# Patient Record
Sex: Female | Born: 1990 | Race: Black or African American | Hispanic: No | Marital: Single | Smoking: Never smoker
Health system: Southern US, Community
[De-identification: ages and names within clinical notes are randomized; demographics above are authoritative.]

## PROBLEM LIST (undated history)

## (undated) DIAGNOSIS — O24419 Gestational diabetes mellitus in pregnancy, unspecified control: Secondary | ICD-10-CM

## (undated) HISTORY — DX: Gestational diabetes mellitus in pregnancy, unspecified control: O24.419

## (undated) HISTORY — PX: NO PAST SURGERIES: SHX2092

---

## 2017-03-31 NOTE — L&D Delivery Note (Addendum)
Patient is a 27 y.o. now G2P2 s/p NSVD at 4944w3d, who was admitted for SOL.  She progressed without augmentation to complete and pushed 6 minutes to deliver in water birth tub. Cord clamping not delayed due to no muscle tone or respiratory effort after vigorous stimulation. Code Apgar initiated Clamped by CNM and cut by FOB. Baby immediately handed over to NICU for resuscitation. Mom moved from water birth tub to bed for delivery of placenta. Placenta intact and spontaneous, bleeding minimal. Mom and baby stable prior to transfer to postpartum. She plans on breastfeeding. She is undecided of method for birth control.  Delivery Note At 4:37 AM a viable female was delivered via Vaginal, Spontaneous (Presentation: OA ).  APGAR: 1, 9; weight pending.   Placenta intact and spontaneous, bleeding minimal. 3V Cord:  with the following complications: Code Apgar .  Cord pH: pending   Anesthesia: None  Episiotomy: None Lacerations: None Suture Repair: None Est. Blood Loss (mL): 100  Mom to postpartum.  Baby to Couplet care / Skin to Skin.  Sharyon CableVeronica C Genna Casimir CNM  11/12/2017, 5:09 AM

## 2017-09-07 ENCOUNTER — Ambulatory Visit (INDEPENDENT_AMBULATORY_CARE_PROVIDER_SITE_OTHER): Payer: Medicaid Other | Admitting: Obstetrics & Gynecology

## 2017-09-07 ENCOUNTER — Encounter: Payer: Self-pay | Admitting: Obstetrics & Gynecology

## 2017-09-07 DIAGNOSIS — Z3400 Encounter for supervision of normal first pregnancy, unspecified trimester: Secondary | ICD-10-CM

## 2017-09-07 DIAGNOSIS — O99210 Obesity complicating pregnancy, unspecified trimester: Secondary | ICD-10-CM | POA: Insufficient documentation

## 2017-09-07 DIAGNOSIS — Z3403 Encounter for supervision of normal first pregnancy, third trimester: Secondary | ICD-10-CM

## 2017-09-07 DIAGNOSIS — E669 Obesity, unspecified: Secondary | ICD-10-CM

## 2017-09-07 DIAGNOSIS — O99213 Obesity complicating pregnancy, third trimester: Secondary | ICD-10-CM

## 2017-09-07 NOTE — Progress Notes (Signed)
  Subjective:    Evelyn Sutton is being seen today for her first obstetrical visit.   She is at 1458w0d gestation. Her obstetrical history is significant for obesity. Relationship with FOB: spouse, living together. Patient does intend to breast feed. Pregnancy history fully reviewed.  Patient reports no complaints.  Review of Systems:   Review of Systems  Objective:     BP 108/75   Pulse (!) 111   Ht 5\' 5"  (1.651 m)   Wt 236 lb (107 kg)   LMP 01/26/2017   BMI 39.27 kg/m  Physical Exam  Exam    Assessment:    Pregnancy: G2P1001 Patient Active Problem List   Diagnosis Date Noted  . Supervision of normal first pregnancy, antepartum 09/07/2017  . Obesity in pregnancy 09/07/2017       Plan:     Initial labs drawn. Prenatal vitamins. Problem list reviewed and updated. Role of ultrasound in pregnancy discussed; fetal survey: ordered. Amniocentesis discussed: not indicated. Follow up in 2 weeks. TDAP, 28 week labs later this week when she is fasting   Evelyn Sutton 09/07/2017

## 2017-09-07 NOTE — Progress Notes (Signed)
Last pap 02/12/17- normal PT will do 2 hour GTT this week Pt is unsure about Tdap

## 2017-09-07 NOTE — Progress Notes (Signed)
PT refused to give e-mail address so I am unable to sign her up for babyscripts app

## 2017-09-11 ENCOUNTER — Encounter (HOSPITAL_COMMUNITY): Payer: Self-pay

## 2017-09-11 ENCOUNTER — Other Ambulatory Visit: Payer: Medicaid Other

## 2017-09-11 DIAGNOSIS — Z3483 Encounter for supervision of other normal pregnancy, third trimester: Secondary | ICD-10-CM

## 2017-09-14 LAB — CBC
HCT: 27.6 % — ABNORMAL LOW (ref 35.0–45.0)
Hemoglobin: 9.1 g/dL — ABNORMAL LOW (ref 11.7–15.5)
MCH: 28 pg (ref 27.0–33.0)
MCHC: 33 g/dL (ref 32.0–36.0)
MCV: 84.9 fL (ref 80.0–100.0)
MPV: 10.4 fL (ref 7.5–12.5)
PLATELETS: 212 10*3/uL (ref 140–400)
RBC: 3.25 10*6/uL — ABNORMAL LOW (ref 3.80–5.10)
RDW: 14.2 % (ref 11.0–15.0)
WBC: 7.9 10*3/uL (ref 3.8–10.8)

## 2017-09-14 LAB — RPR: RPR: NONREACTIVE

## 2017-09-14 LAB — HIV ANTIBODY (ROUTINE TESTING W REFLEX): HIV: NONREACTIVE

## 2017-09-14 LAB — 2HR GTT W 1 HR, CARPENTER, 75 G
GLUCOSE, FASTING, GEST: 89 mg/dL (ref 65–91)
Glucose, 1 Hr, Gest: 155 mg/dL (ref 65–179)
Glucose, 2 Hr, Gest: 140 mg/dL (ref 65–152)

## 2017-09-18 ENCOUNTER — Other Ambulatory Visit: Payer: Self-pay | Admitting: Obstetrics & Gynecology

## 2017-09-18 ENCOUNTER — Ambulatory Visit (HOSPITAL_COMMUNITY)
Admission: RE | Admit: 2017-09-18 | Discharge: 2017-09-18 | Disposition: A | Discharge status: Home / Self Care | Payer: Medicaid Other | Source: Ambulatory Visit | Attending: Obstetrics & Gynecology | Admitting: Obstetrics & Gynecology

## 2017-09-18 DIAGNOSIS — Z363 Encounter for antenatal screening for malformations: Secondary | ICD-10-CM | POA: Insufficient documentation

## 2017-09-18 DIAGNOSIS — Z3A33 33 weeks gestation of pregnancy: Secondary | ICD-10-CM | POA: Insufficient documentation

## 2017-09-18 DIAGNOSIS — O0933 Supervision of pregnancy with insufficient antenatal care, third trimester: Secondary | ICD-10-CM | POA: Diagnosis not present

## 2017-09-18 DIAGNOSIS — O99213 Obesity complicating pregnancy, third trimester: Secondary | ICD-10-CM | POA: Insufficient documentation

## 2017-09-18 DIAGNOSIS — Z3400 Encounter for supervision of normal first pregnancy, unspecified trimester: Secondary | ICD-10-CM

## 2017-09-18 NOTE — Addendum Note (Signed)
Encounter addended by: Levonne HubertStalter, Naziah Portee M, RDMS, RVT on: 09/18/2017 11:39 AM  Actions taken: Imaging Exam ended

## 2017-09-21 ENCOUNTER — Encounter: Payer: Self-pay | Admitting: Advanced Practice Midwife

## 2017-09-21 ENCOUNTER — Ambulatory Visit (INDEPENDENT_AMBULATORY_CARE_PROVIDER_SITE_OTHER): Payer: Medicaid Other | Admitting: Advanced Practice Midwife

## 2017-09-21 VITALS — BP 123/77 | HR 100 | Wt 239.0 lb

## 2017-09-21 DIAGNOSIS — O26843 Uterine size-date discrepancy, third trimester: Secondary | ICD-10-CM

## 2017-09-21 DIAGNOSIS — Z3A37 37 weeks gestation of pregnancy: Secondary | ICD-10-CM

## 2017-09-21 DIAGNOSIS — O99013 Anemia complicating pregnancy, third trimester: Secondary | ICD-10-CM

## 2017-09-21 DIAGNOSIS — Z362 Encounter for other antenatal screening follow-up: Secondary | ICD-10-CM

## 2017-09-21 MED ORDER — DOCUSATE SODIUM 100 MG PO CAPS: 100.0000 mg | ORAL_CAPSULE | Freq: Two times a day (BID) | ORAL | 2 refills | Status: DC | PRN

## 2017-09-21 MED ORDER — CONCEPT DHA 53.5-38-1 MG PO CAPS
1.0000 | ORAL_CAPSULE | Freq: Every day | ORAL | 12 refills | Status: DC
Start: 2017-09-21 — End: 2019-01-12

## 2017-09-21 NOTE — Progress Notes (Incomplete)
   PRENATAL VISIT NOTE  Subjective:  Evelyn Sutton is a 27 y.o. G2P1001 at [redacted]w[redacted]d being seen today for ongoing prenatal care.  She is currently monitored for the following issues for this {Blank single:19197::"high-risk","low-risk"} pregnancy and has Supervision of normal first pregnancy, antepartum; Obesity in pregnancy; and Uterine size date discrepancy pregnancy, third trimester on their problem list.  Patient reports {sx:14538}.  Contractions: Irritability. Vag. Bleeding: None.  Movement: Present. Denies leaking of fluid.   The following portions of the patient's history were reviewed and updated as appropriate: allergies, current medications, past family history, past medical history, past social history, past surgical history and problem list. Problem list updated.  Objective:   Vitals:   09/21/17 0941  BP: 123/77  Pulse: 100  Weight: 239 lb (108.4 kg)    Fetal Status: Fetal Heart Rate (bpm): 145 Fundal Height: 40 cm Movement: Present  Presentation: Vertex  General:  Alert, oriented and cooperative. Patient is in no acute distress.  Skin: Skin is warm and dry. No rash noted.   Cardiovascular: Normal heart rate noted  Respiratory: Normal respiratory effort, no problems with respiration noted  Abdomen: Soft, gravid, appropriate for gestational age.  Pain/Pressure: Present     Pelvic: {Blank single:19197::"Cervical exam performed","Cervical exam deferred"}        Extremities: Normal range of motion.  Edema: None  Mental Status: Normal mood and affect. Normal behavior. Normal judgment and thought content.   Assessment and Plan:  Pregnancy: G2P1001 at [redacted]w[redacted]d  1. Anemia during pregnancy in third trimester *** - Prenat-FeFum-FePo-FA-Omega 3 (CONCEPT DHA) 53.5-38-1 MG CAPS; Take 1 tablet by mouth daily.  Dispense: 30 capsule; Refill: 12 - docusate sodium (COLACE) 100 MG capsule; Take 1 capsule (100 mg total) by mouth 2 (two) times daily as needed.  Dispense: 30 capsule; Refill: 2 -  US MFM OB FOLLOW UP; Future  2. Uterine size date discrepancy pregnancy, third trimester *** - US MFM OB FOLLOW UP; Future  3. Encounter for other antenatal screening follow-up *** - US MFM OB FOLLOW UP; Future  4. [redacted] weeks gestation of pregnancy *** - US MFM OB FOLLOW UP; Future  {Blank single:19197::"Term","Preterm"} labor symptoms and general obstetric precautions including but not limited to vaginal bleeding, contractions, leaking of fluid and fetal movement were reviewed in detail with the patient. Please refer to After Visit Summary for other counseling recommendations.  No follow-ups on file.  No future appointments.  Troye Hiemstra, CNM 

## 2017-09-21 NOTE — Patient Instructions (Signed)
Braxton Hicks Contractions °Contractions of the uterus can occur throughout pregnancy, but they are not always a sign that you are in labor. You may have practice contractions called Braxton Hicks contractions. These false labor contractions are sometimes confused with true labor. °What are Braxton Hicks contractions? °Braxton Hicks contractions are tightening movements that occur in the muscles of the uterus before labor. Unlike true labor contractions, these contractions do not result in opening (dilation) and thinning of the cervix. Toward the end of pregnancy (32-34 weeks), Braxton Hicks contractions can happen more often and may become stronger. These contractions are sometimes difficult to tell apart from true labor because they can be very uncomfortable. You should not feel embarrassed if you go to the hospital with false labor. °Sometimes, the only way to tell if you are in true labor is for your health care provider to look for changes in the cervix. The health care provider will do a physical exam and may monitor your contractions. If you are not in true labor, the exam should show that your cervix is not dilating and your water has not broken. °If there are other health problems associated with your pregnancy, it is completely safe for you to be sent home with false labor. You may continue to have Braxton Hicks contractions until you go into true labor. °How to tell the difference between true labor and false labor °True labor °· Contractions last 30-70 seconds. °· Contractions become very regular. °· Discomfort is usually felt in the top of the uterus, and it spreads to the lower abdomen and low back. °· Contractions do not go away with walking. °· Contractions usually become more intense and increase in frequency. °· The cervix dilates and gets thinner. °False labor °· Contractions are usually shorter and not as strong as true labor contractions. °· Contractions are usually irregular. °· Contractions  are often felt in the front of the lower abdomen and in the groin. °· Contractions may go away when you walk around or change positions while lying down. °· Contractions get weaker and are shorter-lasting as time goes on. °· The cervix usually does not dilate or become thin. °Follow these instructions at home: °· Take over-the-counter and prescription medicines only as told by your health care provider. °· Keep up with your usual exercises and follow other instructions from your health care provider. °· Eat and drink lightly if you think you are going into labor. °· If Braxton Hicks contractions are making you uncomfortable: °? Change your position from lying down or resting to walking, or change from walking to resting. °? Sit and rest in a tub of warm water. °? Drink enough fluid to keep your urine pale yellow. Dehydration may cause these contractions. °? Do slow and deep breathing several times an hour. °· Keep all follow-up prenatal visits as told by your health care provider. This is important. °Contact a health care provider if: °· You have a fever. °· You have continuous pain in your abdomen. °Get help right away if: °· Your contractions become stronger, more regular, and closer together. °· You have fluid leaking or gushing from your vagina. °· You pass blood-tinged mucus (bloody show). °· You have bleeding from your vagina. °· You have low back pain that you never had before. °· You feel your baby’s head pushing down and causing pelvic pressure. °· Your baby is not moving inside you as much as it used to. °Summary °· Contractions that occur before labor are called Braxton   Hicks contractions, false labor, or practice contractions. °· Braxton Hicks contractions are usually shorter, weaker, farther apart, and less regular than true labor contractions. True labor contractions usually become progressively stronger and regular and they become more frequent. °· Manage discomfort from Braxton Hicks contractions by  changing position, resting in a warm bath, drinking plenty of water, or practicing deep breathing. °This information is not intended to replace advice given to you by your health care provider. Make sure you discuss any questions you have with your health care provider. °Document Released: 07/31/2016 Document Revised: 07/31/2016 Document Reviewed: 07/31/2016 °Elsevier Interactive Patient Education © 2018 Elsevier Inc[employer] ° °

## 2017-09-21 NOTE — Progress Notes (Incomplete)
   PRENATAL VISIT NOTE  Subjective:  Evelyn Sutton is a 27 y.o. G2P1001 at 6550w0d being seen today for ongoing prenatal care.  She is currently monitored for the following issues for this {Blank single:19197::"high-risk","low-risk"} pregnancy and has Supervision of normal first pregnancy, antepartum; Obesity in pregnancy; and Uterine size date discrepancy pregnancy, third trimester on their problem list.  Patient reports {sx:14538}.  Contractions: Irritability. Vag. Bleeding: None.  Movement: Present. Denies leaking of fluid.   The following portions of the patient's history were reviewed and updated as appropriate: allergies, current medications, past family history, past medical history, past social history, past surgical history and problem list. Problem list updated.  Objective:   Vitals:   09/21/17 0941  BP: 123/77  Pulse: 100  Weight: 239 lb (108.4 kg)    Fetal Status: Fetal Heart Rate (bpm): 145 Fundal Height: 40 cm Movement: Present  Presentation: Vertex  General:  Alert, oriented and cooperative. Patient is in no acute distress.  Skin: Skin is warm and dry. No rash noted.   Cardiovascular: Normal heart rate noted  Respiratory: Normal respiratory effort, no problems with respiration noted  Abdomen: Soft, gravid, appropriate for gestational age.  Pain/Pressure: Present     Pelvic: {Blank single:19197::"Cervical exam performed","Cervical exam deferred"}        Extremities: Normal range of motion.  Edema: None  Mental Status: Normal mood and affect. Normal behavior. Normal judgment and thought content.   Assessment and Plan:  Pregnancy: G2P1001 at 7450w0d  1. Anemia during pregnancy in third trimester *** - Prenat-FeFum-FePo-FA-Omega 3 (CONCEPT DHA) 53.5-38-1 MG CAPS; Take 1 tablet by mouth daily.  Dispense: 30 capsule; Refill: 12 - docusate sodium (COLACE) 100 MG capsule; Take 1 capsule (100 mg total) by mouth 2 (two) times daily as needed.  Dispense: 30 capsule; Refill: 2 -  US MFM OB FOLLOW UP; Future  2. Uterine size date discrepancy pregnancy, third trimester *** - US MFM OB FOLLOW UP; Future  3. Encounter for other antenatal screening follow-up *** - US MFM OB FOLLOW UP; Future  4. [redacted] weeks gestation of pregnancy *** - US MFM OB FOLLOW UP; Future  {Blank single:19197::"Term","Preterm"} labor symptoms and general obstetric precautions including but not limited to vaginal bleeding, contractions, leaking of fluid and fetal movement were reviewed in detail with the patient. Please refer to After Visit Summary for other counseling recommendations.  No follow-ups on file.  No future appointments.  Dorathy KinsmanVirginia Fadel Clason, CNM

## 2017-09-21 NOTE — Progress Notes (Signed)
   PRENATAL VISIT NOTE  Subjective:  Evelyn Sutton is a 27 y.o. G2P1001 at 46105w0d being seen today for ongoing prenatal care.  She is currently monitored for the following issues for this low-risk pregnancy and has Supervision of normal first pregnancy, antepartum; Obesity in pregnancy; and Uterine size date discrepancy pregnancy, third trimester on their problem list.  Patient reports occasional contractions, right groin pain worse w/ mvmt.  Contractions: Irritability. Vag. Bleeding: None.  Movement: Present. Denies leaking of fluid.   The following portions of the patient's history were reviewed and updated as appropriate: allergies, current medications, past family history, past medical history, past social history, past surgical history and problem list. Problem list updated.  Objective:   Vitals:   09/21/17 0941  BP: 123/77  Pulse: 100  Weight: 239 lb (108.4 kg)    Fetal Status: Fetal Heart Rate (bpm): 145 Fundal Height: 40 cm Movement: Present  Presentation: Vertex  General:  Alert, oriented and cooperative. Patient is in no acute distress.  Skin: Skin is warm and dry. No rash noted.   Cardiovascular: Normal heart rate noted  Respiratory: Normal respiratory effort, no problems with respiration noted  Abdomen: Soft, gravid, appropriate for gestational age.  Pain/Pressure: Present     Pelvic: Cervical exam deferred        Extremities: Normal range of motion.  Edema: None  Mental Status: Normal mood and affect. Normal behavior. Normal judgment and thought content.   Assessment and Plan:  Pregnancy: G2P1001 at 21105w0d  1. Anemia during pregnancy in third trimester  - Prenat-FeFum-FePo-FA-Omega 3 (CONCEPT DHA) 53.5-38-1 MG CAPS; Take 1 tablet by mouth daily.  Dispense: 30 capsule; Refill: 12 - docusate sodium (COLACE) 100 MG capsule; Take 1 capsule (100 mg total) by mouth 2 (two) times daily as needed.  Dispense: 30 capsule; Refill: 2 - US MFM OB FOLLOW UP; Future  2. Uterine  size date discrepancy pregnancy, third trimester  - US MFM OB FOLLOW UP; Future  3. Encounter for other antenatal screening follow-up  - US MFM OB FOLLOW UP; Future  4. [redacted] weeks gestation of pregnancy  - US MFM OB FOLLOW UP; Future  Preterm labor symptoms and general obstetric precautions including but not limited to vaginal bleeding, contractions, leaking of fluid and fetal movement were reviewed in detail with the patient. Please refer to After Visit Summary for other counseling recommendations.  Return in about 2 weeks (around 10/05/2017) for ROB.  No future appointments.  Dorathy KinsmanVirginia Taquisha Phung, CNM

## 2017-10-05 ENCOUNTER — Other Ambulatory Visit (HOSPITAL_COMMUNITY)
Admission: RE | Admit: 2017-10-05 | Discharge: 2017-10-05 | Disposition: A | Discharge status: Home / Self Care | Payer: Medicaid Other | Source: Ambulatory Visit | Attending: Advanced Practice Midwife | Admitting: Advanced Practice Midwife

## 2017-10-05 ENCOUNTER — Ambulatory Visit (INDEPENDENT_AMBULATORY_CARE_PROVIDER_SITE_OTHER): Payer: Medicaid Other | Admitting: Advanced Practice Midwife

## 2017-10-05 VITALS — BP 123/79 | HR 98 | Wt 244.0 lb

## 2017-10-05 DIAGNOSIS — Z3483 Encounter for supervision of other normal pregnancy, third trimester: Secondary | ICD-10-CM | POA: Insufficient documentation

## 2017-10-05 NOTE — Patient Instructions (Addendum)
Labor Precautions Reasons to come to [facility]:  1.  Contractions are  5 minutes apart or less, each last 1 minute, these have been going on for 1-2 hours, and you cannot walk or talk during them 2.  You have a large gush of fluid, or a trickle of fluid that will not stop and you have to wear a pad 3.  You have bleeding that is bright red, heavier than spotting--like menstrual bleeding (spotting can be normal in early labor or after a check of your cervix) 4.  You do not feel the baby moving like he/she normally does   Pregnancy and Travel Most pregnant woman can safely travel until the last month of the pregnancy. However, pregnant women with medical problems or problems with their pregnancy should limit or avoid travel. The best time to travel is between 14 and 28 weeks of the pregnancy. During this period, morning sickness should be minimal and other problems are less likely to develop. General travel tips Before you go:  Discuss your trip with your health care provider and get examined shortly before you go.  Get a copy of your medical records and be sure to take them with you.  Try to get names of doctors and hospitals in the area you will be visiting.  Pack your pillow if you can.  Get a good night's sleep the night before you make your trip.  During your trip:  Ask for locations of doctors and hospitals if you did not do this before leaving.  Wear flat, comfortable shoes.  Eat a balanced diet, drink a lot of fluids, and take your vitamins and supplements.  Do not wear yourself out.  Do not ride on a motorcycle.  Rest. If you spent a lot of time traveling, lie down for 30 or more minutes with your feet slightly raised after you reach your destination.  Tips for traveling to a foreign country Before you go:  Ask your health care provider if there are any medicines that are safe to take if you get diarrhea, constipation, nausea, or vomiting.  Make copies of your medical  records in case you lose the originals.  During your trip:  Do not eat uncooked foods of any kind.  Drink bottled water and do not use ice.  Wash fruits and vegetables with hot, soapy water.  Only drink pasteurized milk.  Tips for traveling by car  Wear your seat belt properly.  If you are in the front seat, sit as far away from the dashboard as possible to avoid getting hit hard if the air bag deploys in an accident.  Stop about every 2 hours to use the restroom and walk around. This helps the circulation in your legs.  Keep water, crackers, and fruit in the car.  Do not travel for more than 6 hours a day. Tips for traveling by bus  Before making a reservation, ask whether your bus will have a restroom.  Take water, crackers, and fruit with you.  Get out and walk around if and when the bus stops.  Move your arms and legs when seated. This helps with your circulation. Tips for traveling by train Before making a reservation, ask if your train will have a sleeping car and more than one restroom. Tips for traveling by airplane  Before booking your trip, ask about the airline's rules about pregnancy. Pregnant women may be restricted from flying after a certain time of the pregnancy. Every airline has its own  rules and regulations.  Ask whether the airplane cabin will be pressurized. Do not board an unpressurized plane that will fly above 7,000 ft (2,100 km).  Try to get a bulkhead or an aisle seat.  Wear layered clothing because the temperature in the cabin can change.  Take water, crackers, and fruit with you on the airplane.  Put all your medicines and medical records in your carry-on bag.  Avoid drinks with caffeine and do not eat a big meal.  Do not walk around the airplane to stretch your legs.  Move your arms and legs while sitting to help with your circulation.  Wear your seat belt at all times. Tips for traveling by cruise ship  Before booking your trip,  ask the following questions: ? Are pregnant women allowed on the cruise ship? ? Is there a medical facility and health care provider on board? ? Does the ship dock in cities where there are health care providers and medical facilities?  Before booking your trip, ask your health care provider if: ? It is safe for you to take medicines if you get seasick. ? It is safe for you to wear acupressure wristbands to prevent getting seasick. If your health care provider says it is safe, consider purchasing one. This information is not intended to replace advice given to you by your health care provider. Make sure you discuss any questions you have with your health care provider. Document Released: 02/28/2008 Document Revised: 08/23/2015 Document Reviewed: 02/11/2013 Elsevier Interactive Patient Education  2017 [employer].

## 2017-10-05 NOTE — Progress Notes (Signed)
   PRENATAL VISIT NOTE  Subjective:  Evelyn Sutton is a 27 y.o. G2P1001 at 6129w0d being seen today for ongoing prenatal care.  She is currently monitored for the following issues for this low-risk pregnancy and has Supervision of normal first pregnancy, antepartum; Obesity in pregnancy; and Uterine size date discrepancy pregnancy, third trimester on their problem list.  Patient reports no complaints.  Contractions: Irritability. Vag. Bleeding: None.  Movement: Present. Denies leaking of fluid.   The following portions of the patient's history were reviewed and updated as appropriate: allergies, current medications, past family history, past medical history, past social history, past surgical history and problem list. Problem list updated.  Objective:   Vitals:   10/05/17 1014  BP: 123/79  Pulse: 98  Weight: 244 lb (110.7 kg)    Fetal Status: Fetal Heart Rate (bpm): 153 Fundal Height: 40 cm Movement: Present     General:  Alert, oriented and cooperative. Patient is in no acute distress.  Skin: Skin is warm and dry. No rash noted.   Cardiovascular: Normal heart rate noted  Respiratory: Normal respiratory effort, no problems with respiration noted  Abdomen: Soft, gravid, appropriate for gestational age.  Pain/Pressure: Present     Pelvic: Cervical exam deferred        Extremities: Normal range of motion.  Edema: None  Mental Status: Normal mood and affect. Normal behavior. Normal judgment and thought content.   Assessment and Plan:  Pregnancy: G2P1001 at 5929w0d  1. Encounter for supervision of other normal pregnancy in third trimester --Anticipatory guidance about next visits/weeks of pregnancy given. - Culture, beta strep (group b only) - [facility]lam[facility]  Term labor symptoms and general obstetric precautions including but not limited to vaginal bleeding, contractions, leaking of fluid and fetal movement were reviewed in detail with the  patient. Please refer to After Visit Summary for other counseling recommendations.  No follow-ups on file.  Future Appointments  Date Time Provider Department Center  10/12/2017 10:15 AM Aviva SignsWilliams, Marie L, CNM [facility]  10/16/2017 10:45 AM [facility] US 2 [facility]  10/19/2017  9:30 AM Sharyon Cableogers, Veronica C, CNM [facility]    Sharen CounterLisa Leftwich-Kirby, CNM

## 2017-10-06 LAB — GC/CHLAMYDIA PROBE AMP (~~LOC~~) NOT AT ARMC
CHLAMYDIA, DNA PROBE: NEGATIVE
NEISSERIA GONORRHEA: NEGATIVE

## 2017-10-08 LAB — CULTURE, BETA STREP (GROUP B ONLY)
MICRO NUMBER:: 90806390
SPECIMEN QUALITY: ADEQUATE

## 2017-10-12 ENCOUNTER — Encounter: Payer: Self-pay | Admitting: Advanced Practice Midwife

## 2017-10-12 ENCOUNTER — Ambulatory Visit (INDEPENDENT_AMBULATORY_CARE_PROVIDER_SITE_OTHER): Payer: Medicaid Other | Admitting: Advanced Practice Midwife

## 2017-10-12 DIAGNOSIS — Z3483 Encounter for supervision of other normal pregnancy, third trimester: Secondary | ICD-10-CM

## 2017-10-12 DIAGNOSIS — Z3400 Encounter for supervision of normal first pregnancy, unspecified trimester: Secondary | ICD-10-CM

## 2017-10-12 NOTE — Progress Notes (Deleted)
   PRENATAL VISIT NOTE  Subjective:  Evelyn Sutton is a 27 y.o. G2P1001 at [redacted]w[redacted]d being seen today for ongoing prenatal care.  She is currently monitored for the following issues for this {Blank single:19197::"high-risk","low-risk"} pregnancy and has Supervision of normal first pregnancy, antepartum; Obesity in pregnancy; and Uterine size date discrepancy pregnancy, third trimester on their problem list.  Patient reports {sx:14538}.  Contractions: Irritability. Vag. Bleeding: None.  Movement: Present. Denies leaking of fluid.   The following portions of the patient's history were reviewed and updated as appropriate: allergies, current medications, past family history, past medical history, past social history, past surgical history and problem list. Problem list updated.  Objective:   Vitals:   10/12/17 1035  BP: 113/69  Pulse: 90  Weight: 243 lb (110.2 kg)    Fetal Status: Fetal Heart Rate (bpm): 148   Movement: Present     General:  Alert, oriented and cooperative. Patient is in no acute distress.  Skin: Skin is warm and dry. No rash noted.   Cardiovascular: Normal heart rate noted  Respiratory: Normal respiratory effort, no problems with respiration noted  Abdomen: Soft, gravid, appropriate for gestational age.  Pain/Pressure: Present     Pelvic: {Blank single:19197::"Cervical exam performed","Cervical exam deferred"}        Extremities: Normal range of motion.  Edema: Trace  Mental Status: Normal mood and affect. Normal behavior. Normal judgment and thought content.   Assessment and Plan:  Pregnancy: G2P1001 at [redacted]w[redacted]d  1. Supervision of normal first pregnancy, antepartum ***  {Blank single:19197::"Term","Preterm"} labor symptoms and general obstetric precautions including but not limited to vaginal bleeding, contractions, leaking of fluid and fetal movement were reviewed in detail with the patient. Please refer to After Visit Summary for other counseling recommendations.  No  follow-ups on file.  Future Appointments  Date Time Provider Department Center  10/16/2017 10:45 AM [facility] US 2 [facility]  10/19/2017  9:30 AM Rogers, Veronica C, CNM [facility]    [name], CNM 

## 2017-10-12 NOTE — Progress Notes (Incomplete)
   PRENATAL VISIT NOTE  Subjective:  Evelyn Sutton is a 27 y.o. G2P1001 at 5088w0d being seen today for ongoing prenatal care.  She is currently monitored for the following issues for this {Blank single:19197::"high-risk","low-risk"} pregnancy and has Supervision of normal first pregnancy, antepartum; Obesity in pregnancy; and Uterine size date discrepancy pregnancy, third trimester on their problem list.  Patient reports {sx:14538}.  Contractions: Irritability. Vag. Bleeding: None.  Movement: Present. Denies leaking of fluid.   The following portions of the patient's history were reviewed and updated as appropriate: allergies, current medications, past family history, past medical history, past social history, past surgical history and problem list. Problem list updated.  Objective:   Vitals:   10/12/17 1035  BP: 113/69  Pulse: 90  Weight: 243 lb (110.2 kg)    Fetal Status: Fetal Heart Rate (bpm): 148   Movement: Present     General:  Alert, oriented and cooperative. Patient is in no acute distress.  Skin: Skin is warm and dry. No rash noted.   Cardiovascular: Normal heart rate noted  Respiratory: Normal respiratory effort, no problems with respiration noted  Abdomen: Soft, gravid, appropriate for gestational age.  Pain/Pressure: Present     Pelvic: {Blank single:19197::"Cervical exam performed","Cervical exam deferred"}        Extremities: Normal range of motion.  Edema: Trace  Mental Status: Normal mood and affect. Normal behavior. Normal judgment and thought content.   Assessment and Plan:  Pregnancy: G2P1001 at 4288w0d  1. Supervision of normal first pregnancy, antepartum ***  {Blank single:19197::"Term","Preterm"} labor symptoms and general obstetric precautions including but not limited to vaginal bleeding, contractions, leaking of fluid and fetal movement were reviewed in detail with the patient. Please refer to After Visit Summary for other counseling recommendations.  No  follow-ups on file.  Future Appointments  Date Time Provider Department Center  10/16/2017 10:45 AM [facility] US 2 [facility]  10/19/2017  9:30 AM Sharyon Cableogers, Veronica C, CNM [facility]    Wynelle BourgeoisMarie Maymie Brunke, CNM

## 2017-10-12 NOTE — Patient Instructions (Signed)

## 2017-10-12 NOTE — Progress Notes (Signed)
PRENATAL VISIT NOTE  Subjective:  Evelyn Sutton is a 27 y.o. G2P1001 at [redacted]w[redacted]d being seen today for ongoing prenatal care.  She is currently monitored for the following issues for this low-risk pregnancy and has Supervision of normal first pregnancy, antepartum; Obesity in pregnancy; and Uterine size date discrepancy pregnancy, third trimester on their problem list.  Patient reports occasional contractions.  Contractions: Irritability. Vag. Bleeding: None.  Movement: Present. Denies leaking of fluid.   The following portions of the patient's history were reviewed and updated as appropriate: allergies, current medications, past family history, past medical history, past social history, past surgical history and problem list. Problem list updated.  Objective:   Vitals:   10/12/17 1035  BP: 113/69  Pulse: 90  Weight: 243 lb (110.2 kg)    Fetal Status: Fetal Heart Rate (bpm): 148   Movement: Present     General:  Alert, oriented and cooperative. Patient is in no acute distress.  Skin: Skin is warm and dry. No rash noted.   Cardiovascular: Normal heart rate noted  Respiratory: Normal respiratory effort, no problems with respiration noted  Abdomen: Soft, gravid, appropriate for gestational age.  Pain/Pressure: Present     Pelvic: Cervical exam deferred        Extremities: Normal range of motion.  Edema: Trace  Mental Status: Normal mood and affect. Normal behavior. Normal judgment and thought content.   Assessment and Plan:  Pregnancy: G2P1001 at [redacted]w[redacted]d  1. Supervision of normal first pregnancy, antepartum      States she is interested in waterbirth. Plans to take class this week. Discussed preps and tub options as well as possible contraindications. She did not bring it up until the end of the visit.  WIll plan on signing consent next visit if she makes it to the class  Considering Waterbirth? Guide for patients at [facility] [employer] [facility]  Why consider  waterbirth?  . Gentle birth for babies . Less pain medicine used in labor . May allow for passive descent/less pushing . May reduce perineal tears  . More mobility and instinctive maternal position changes . Increased maternal relaxation . Reduced blood pressure in labor  Is waterbirth safe? What are the risks of infection, drowning or other complications?  . Infection: o Very low risk (3.7 % for tub vs 4.8% for bed) o 7 in 8000 waterbirths with documented infection o Poorly cleaned equipment most common cause o Slightly lower group B strep transmission rate  . Drowning o Maternal:  - Very low risk   - Related to seizures or fainting o Newborn:  - Very low risk. No evidence of increased risk of respiratory problems in multiple large studies - Physiological protection from breathing under water - Avoid underwater birth if there are any fetal complications - Once baby's head is out of the water, keep it out.  . Birth complication o Some reports of cord trauma, but risk decreased by bringing baby to surface gradually o No evidence of increased risk of shoulder dystocia. Mothers can usually change positions faster in water than in a bed, possibly aiding the maneuvers to free the shoulder.   Am I a candidate for waterbirth?  Yes, if you are: . Full-term (37 weeks or greater)  . Have had an uncomplicated pregnancy and labor  No, if you have: Marland Kitchen Preterm birth less than 37 weeks . Thick, particulate meconium stained fluid . Maternal fever over 101 . Heavy bleeding or signs of placental abruption . Pre-eclampsia  .  Any abnormal fetal heart rate pattern . Breech presentation . Twins  . Very large baby . Active communicable infection (this does NOT include group B strep) . Significant limitation to mobility  Please remember that birth is unpredictable. Under certain unforeseeable circumstances your provider may advise against giving birth in the tub. These decisions will be  made on a case-by-case basis and with the safety of you and your baby as our highest priority.  Requirements for patients planning waterbirth  . Ask your midwife if you will be a candidate for waterbirth. . Attend [facility]. Contact Childbirth Education at [phone] or [phone] for dates and times. The class is free and we strongly encourage you to bring your support person. You will receive a certificate of participation to show to your midwife or doctor. . Supplies o Waterbirth tub (NOT kiddie pool) o Equities traderingle-use disposable tub liner  o Air pump to inflate tub (manual-foot pump or electric) o New garden hose labeled "lead-free", "suitable for drinking water", "non-toxic" OR "water potable" o Faucet adaptor to attach hose to faucet         o Electric drain pump to remove water (We recommend 792 gallon per hour or greater pump.)  o Water thermometer (baby store / pool supplies) o Designer, jewelleryish net o Bathing suit top (optional) o Long-handled mirror (optional)   The above information was reviewed with the patient and she verbally consented and acknowledged the eligibility criteria as well as contraindications and procedures for both labor and birth. The patient also acknowledged that she has been informed that during the course of labor and birth unforeseen conditions may occur which may require her to leave the water. The patients agrees to follow the instructions from the nurse, nurse midwife and/or physician including getting out of the tub if deemed medically necessary.   Term labor symptoms and general obstetric precautions including but not limited to vaginal bleeding, contractions, leaking of fluid and fetal movement were reviewed in detail with the patient. Please refer to After Visit Summary for other counseling recommendations.    Future Appointments  Date Time Provider Department Center  10/16/2017 10:45 AM [facility] US 2 [facility]  10/19/2017  9:30  AM Sharyon Cableogers, Veronica C, CNM [facility]    Wynelle BourgeoisMarie Dabney Dever, CNM

## 2017-10-16 ENCOUNTER — Ambulatory Visit (HOSPITAL_COMMUNITY)
Admission: RE | Admit: 2017-10-16 | Discharge: 2017-10-16 | Disposition: A | Discharge status: Home / Self Care | Payer: Medicaid Other | Source: Ambulatory Visit | Attending: Advanced Practice Midwife | Admitting: Advanced Practice Midwife

## 2017-10-16 DIAGNOSIS — O0933 Supervision of pregnancy with insufficient antenatal care, third trimester: Secondary | ICD-10-CM | POA: Diagnosis not present

## 2017-10-16 DIAGNOSIS — O99213 Obesity complicating pregnancy, third trimester: Secondary | ICD-10-CM | POA: Diagnosis present

## 2017-10-16 DIAGNOSIS — O99013 Anemia complicating pregnancy, third trimester: Secondary | ICD-10-CM

## 2017-10-16 DIAGNOSIS — Z362 Encounter for other antenatal screening follow-up: Secondary | ICD-10-CM

## 2017-10-16 DIAGNOSIS — Z3A37 37 weeks gestation of pregnancy: Secondary | ICD-10-CM

## 2017-10-16 DIAGNOSIS — O26843 Uterine size-date discrepancy, third trimester: Secondary | ICD-10-CM | POA: Diagnosis not present

## 2017-10-19 ENCOUNTER — Encounter: Payer: Self-pay | Admitting: Certified Nurse Midwife

## 2017-10-19 ENCOUNTER — Ambulatory Visit (INDEPENDENT_AMBULATORY_CARE_PROVIDER_SITE_OTHER): Payer: Medicaid Other | Admitting: Certified Nurse Midwife

## 2017-10-19 VITALS — BP 110/75 | HR 100 | Wt 245.0 lb

## 2017-10-19 DIAGNOSIS — Z3400 Encounter for supervision of normal first pregnancy, unspecified trimester: Secondary | ICD-10-CM

## 2017-10-19 DIAGNOSIS — O26843 Uterine size-date discrepancy, third trimester: Secondary | ICD-10-CM

## 2017-10-19 DIAGNOSIS — O99210 Obesity complicating pregnancy, unspecified trimester: Secondary | ICD-10-CM

## 2017-10-19 NOTE — Patient Instructions (Signed)

## 2017-10-21 NOTE — Progress Notes (Signed)
   PRENATAL VISIT NOTE  Subjective:  Evelyn Sutton is a 27 y.o. G2P1001 at 3067w2d being seen today for ongoing prenatal care.  She is currently monitored for the following issues for this low-risk pregnancy and has Supervision of normal first pregnancy, antepartum; Obesity in pregnancy; and Uterine size date discrepancy pregnancy, third trimester on their problem list.  Patient reports occasional contractions.  Contractions: Irritability. Vag. Bleeding: None.  Movement: Present. Denies leaking of fluid.   The following portions of the patient's history were reviewed and updated as appropriate: allergies, current medications, past family history, past medical history, past social history, past surgical history and problem list. Problem list updated.  Objective:   Vitals:   10/19/17 0932  BP: 110/75  Pulse: 100  Weight: 245 lb (111.1 kg)    Fetal Status: Fetal Heart Rate (bpm): 143 Fundal Height: 42 cm Movement: Present     General:  Alert, oriented and cooperative. Patient is in no acute distress.  Skin: Skin is warm and dry. No rash noted.   Cardiovascular: Normal heart rate noted  Respiratory: Normal respiratory effort, no problems with respiration noted  Abdomen: Soft, gravid, appropriate for gestational age.  Pain/Pressure: Present     Pelvic: Cervical exam deferred        Extremities: Normal range of motion.  Edema: Trace  Mental Status: Normal mood and affect. Normal behavior. Normal judgment and thought content.   Assessment and Plan:  Pregnancy: G2P1001 at 367w2d  1. Supervision of normal first pregnancy, antepartum -patient doing well, no complaints  -patient desires waterbirth, has attended class and plans for [facility] for pool services. She forgot to bring waterbirth certificate, plans to bring at next appointment.  - educated and discussed home methods to induce regular contractions, EPO, RRT, and IC discussed.  - Patient desires cervical examination and possible  membrane sweep at next prenatal visit   2. Obesity in pregnancy -TWG 45lbs this pregnancy   3. Uterine size date discrepancy pregnancy, third trimester -EFW 8-12 at 38 week US   Term labor symptoms and general obstetric precautions including but not limited to vaginal bleeding, contractions, leaking of fluid and fetal movement were reviewed in detail with the patient. Please refer to After Visit Summary for other counseling recommendations.  Return in about 1 week (around 10/26/2017) for ROB.  Future Appointments  Date Time Provider Department Center  10/26/2017  3:00 PM Lesly DukesLeggett, Kelly H, MD [facility]    Sharyon CableVeronica C Konnor Vondrasek, CNM

## 2017-10-26 ENCOUNTER — Ambulatory Visit (INDEPENDENT_AMBULATORY_CARE_PROVIDER_SITE_OTHER): Payer: Medicaid Other | Admitting: Obstetrics & Gynecology

## 2017-10-26 VITALS — BP 127/82 | HR 103 | Wt 245.0 lb

## 2017-10-26 DIAGNOSIS — Z3400 Encounter for supervision of normal first pregnancy, unspecified trimester: Secondary | ICD-10-CM

## 2017-10-26 DIAGNOSIS — O26843 Uterine size-date discrepancy, third trimester: Secondary | ICD-10-CM

## 2017-10-26 NOTE — Progress Notes (Signed)
   PRENATAL VISIT NOTE  Subjective:  Evelyn Sutton is a 27 y.o. G2P1001 at 1868w0d being seen today for ongoing prenatal care.  She is currently monitored for the following issues for this low-risk pregnancy and has Supervision of normal first pregnancy, antepartum; Obesity in pregnancy; and Uterine size date discrepancy pregnancy, third trimester on their problem list.  Patient reports no complaints.  Contractions: Irritability. Vag. Bleeding: None.  Movement: Present. Denies leaking of fluid.   The following portions of the patient's history were reviewed and updated as appropriate: allergies, current medications, past family history, past medical history, past social history, past surgical history and problem list. Problem list updated.  Objective:   Vitals:   10/26/17 1437  BP: 127/82  Pulse: (!) 103  Weight: 245 lb (111.1 kg)    Fetal Status: Fetal Heart Rate (bpm): 142 Fundal Height: 45 cm Movement: Present  Presentation: Vertex  General:  Alert, oriented and cooperative. Patient is in no acute distress.  Skin: Skin is warm and dry. No rash noted.   Cardiovascular: Normal heart rate noted  Respiratory: Normal respiratory effort, no problems with respiration noted  Abdomen: Soft, gravid, appropriate for gestational age.  Pain/Pressure: Present     Pelvic: Cervical exam performed Dilation: 2 Effacement (%): 50 Station: Ballotable  Extremities: Normal range of motion.  Edema: Trace  Mental Status: Normal mood and affect. Normal behavior. Normal judgment and thought content.   Assessment and Plan:  Pregnancy: G2P1001 at 7368w0d  1. Supervision of normal first pregnancy, antepartum If waterbirth--still needs certificate and consent on chart.  2. Uterine size date discrepancy pregnancy, third trimester CBG 6 hours pp 134.  Discussed possibility of late onset GDM.  Fasting was 89 at 28 week 2 hour.  Will rpt 2 hour GTT tomorrow and get CBG with the venous draw.  One elevation = GDM.   Will need discussion about delivery.    Term labor symptoms and general obstetric precautions including but not limited to vaginal bleeding, contractions, leaking of fluid and fetal movement were reviewed in detail with the patient. Please refer to After Visit Summary for other counseling recommendations.  No follow-ups on file.  No future appointments.  Elsie LincolnKelly Creg Gilmer, MD

## 2017-10-27 ENCOUNTER — Other Ambulatory Visit: Payer: Medicaid Other

## 2017-10-27 DIAGNOSIS — Z3400 Encounter for supervision of normal first pregnancy, unspecified trimester: Secondary | ICD-10-CM

## 2017-10-27 DIAGNOSIS — Z3483 Encounter for supervision of other normal pregnancy, third trimester: Secondary | ICD-10-CM

## 2017-10-27 LAB — POCT CBG (FASTING - GLUCOSE)-MANUAL ENTRY: Glucose Fasting, POC: 98 mg/dL (ref 70–99)

## 2017-10-28 LAB — 2HR GTT W 1 HR, CARPENTER, 75 G
GLUCOSE, 1 HR, GEST: 137 mg/dL (ref 65–179)
GLUCOSE, 2 HR, GEST: 135 mg/dL (ref 65–152)
GLUCOSE, FASTING, GEST: 80 mg/dL (ref 65–91)

## 2017-11-02 ENCOUNTER — Ambulatory Visit (INDEPENDENT_AMBULATORY_CARE_PROVIDER_SITE_OTHER): Payer: Medicaid Other | Admitting: Advanced Practice Midwife

## 2017-11-02 ENCOUNTER — Telehealth (HOSPITAL_COMMUNITY): Payer: Self-pay | Admitting: *Deleted

## 2017-11-02 DIAGNOSIS — Z3400 Encounter for supervision of normal first pregnancy, unspecified trimester: Secondary | ICD-10-CM

## 2017-11-02 NOTE — Progress Notes (Signed)
   PRENATAL VISIT NOTE  Subjective:  Evelyn Sutton is a 27 y.o. G2P1001 at 6232w0d being seen today for ongoing prenatal care.  She is currently monitored for the following issues for this low-risk pregnancy and has Supervision of normal first pregnancy, antepartum; Obesity in pregnancy; and Uterine size date discrepancy pregnancy, third trimester on their problem list.  Patient reports occasional contractions.  Contractions: Irritability. Vag. Bleeding: None.  Movement: Present. Denies leaking of fluid.   The following portions of the patient's history were reviewed and updated as appropriate: allergies, current medications, past family history, past medical history, past social history, past surgical history and problem list. Problem list updated.  Objective:   Vitals:   11/02/17 1114  BP: 114/71  Pulse: 80  Weight: 247 lb (112 kg)    Fetal Status: Fetal Heart Rate (bpm): 143   Movement: Present     General:  Alert, oriented and cooperative. Patient is in no acute distress.  Skin: Skin is warm and dry. No rash noted.   Cardiovascular: Normal heart rate noted  Respiratory: Normal respiratory effort, no problems with respiration noted  Abdomen: Soft, gravid, appropriate for gestational age.  Pain/Pressure: Present     Pelvic: Cervical exam performed      1-2/50-60/-3/vtx   Extremities: Normal range of motion.  Edema: Trace  Mental Status: Normal mood and affect. Normal behavior. Normal judgment and thought content.   Assessment and Plan:  Pregnancy: G2P1001 at 7732w0d  1. Supervision of normal first pregnancy, antepartum     Signed waterbirth consent today     Reviewed signs of labor      Membranes swept     NST this week, wants to delay IOL. Made appt for 41.3wks, just incase  Term labor symptoms and general obstetric precautions including but not limited to vaginal bleeding, contractions, leaking of fluid and fetal movement were reviewed in detail with the patient. Please  refer to After Visit Summary for other counseling recommendations.  RTO this week  Future Appointments  Date Time Provider Department Center  11/13/2017  7:30 AM [facility] RO[facility] None    Wynelle BourgeoisMarie Abass Misener, CNM

## 2017-11-02 NOTE — Telephone Encounter (Signed)
Preadmission screen  

## 2017-11-02 NOTE — Progress Notes (Deleted)
   PRENATAL VISIT NOTE  Subjective:  Evelyn Sutton is a 27 y.o. G2P1001 at 4123w0d being seen today for ongoing prenatal care.  She is currently monitored for the following issues for this {Blank single:19197::"high-risk","low-risk"} pregnancy and has Supervision of normal first pregnancy, antepartum; Obesity in pregnancy; and Uterine size date discrepancy pregnancy, third trimester on their problem list.  Patient reports {sx:14538}.  Contractions: Irritability. Vag. Bleeding: None.  Movement: Present. Denies leaking of fluid.   The following portions of the patient's history were reviewed and updated as appropriate: allergies, current medications, past family history, past medical history, past social history, past surgical history and problem list. Problem list updated.  Objective:   Vitals:   11/02/17 1114  BP: 114/71  Pulse: 80  Weight: 247 lb (112 kg)    Fetal Status: Fetal Heart Rate (bpm): 143   Movement: Present     General:  Alert, oriented and cooperative. Patient is in no acute distress.  Skin: Skin is warm and dry. No rash noted.   Cardiovascular: Normal heart rate noted  Respiratory: Normal respiratory effort, no problems with respiration noted  Abdomen: Soft, gravid, appropriate for gestational age.  Pain/Pressure: Present     Pelvic: {Blank single:19197::"Cervical exam performed","Cervical exam deferred"}        Extremities: Normal range of motion.  Edema: Trace  Mental Status: Normal mood and affect. Normal behavior. Normal judgment and thought content.   Assessment and Plan:  Pregnancy: G2P1001 at 523w0d  1. Supervision of normal first pregnancy, antepartum ***  {Blank single:19197::"Term","Preterm"} labor symptoms and general obstetric precautions including but not limited to vaginal bleeding, contractions, leaking of fluid and fetal movement were reviewed in detail with the patient. Please refer to After Visit Summary for other counseling recommendations.  No  follow-ups on file.  Future Appointments  Date Time Provider Department Center  11/13/2017  7:30 AM [facility] RO[facility] None    Wynelle BourgeoisMarie Williams, CNM

## 2017-11-02 NOTE — Patient Instructions (Signed)

## 2017-11-04 ENCOUNTER — Ambulatory Visit (INDEPENDENT_AMBULATORY_CARE_PROVIDER_SITE_OTHER): Payer: Medicaid Other | Admitting: *Deleted

## 2017-11-04 VITALS — BP 117/78

## 2017-11-04 DIAGNOSIS — O480 Post-term pregnancy: Secondary | ICD-10-CM | POA: Diagnosis not present

## 2017-11-09 ENCOUNTER — Encounter: Payer: Self-pay | Admitting: Certified Nurse Midwife

## 2017-11-09 ENCOUNTER — Encounter: Payer: Medicaid Other | Admitting: Certified Nurse Midwife

## 2017-11-09 ENCOUNTER — Ambulatory Visit (INDEPENDENT_AMBULATORY_CARE_PROVIDER_SITE_OTHER): Payer: Medicaid Other | Admitting: Certified Nurse Midwife

## 2017-11-09 VITALS — BP 125/79 | HR 97 | Wt 246.0 lb

## 2017-11-09 DIAGNOSIS — O480 Post-term pregnancy: Secondary | ICD-10-CM

## 2017-11-09 DIAGNOSIS — Z3400 Encounter for supervision of normal first pregnancy, unspecified trimester: Secondary | ICD-10-CM

## 2017-11-09 NOTE — Progress Notes (Signed)
Subjective:  Evelyn Sutton is a 27 y.o. G2P1001 at 5848w0d being seen today for ongoing prenatal care.  She is currently monitored for the following issues for this low-risk pregnancy and has Supervision of normal first pregnancy, antepartum; Obesity in pregnancy; and Uterine size date discrepancy pregnancy, third trimester on their problem list.  Patient reports no complaints.  Contractions: Irregular. Vag. Bleeding: None.  Movement: Present. Denies leaking of fluid.   Requests membrane sweep today.  The following portions of the patient's history were reviewed and updated as appropriate: allergies, current medications, past family history, past medical history, past social history, past surgical history and problem list. Problem list updated.  Objective:   Vitals:   11/09/17 0951  BP: 125/79  Pulse: 97  Weight: 111.6 kg    Fetal Status: Fetal Heart Rate (bpm): 150   Movement: Present  Presentation: Vertex  General:  Alert, oriented and cooperative. Patient is in no acute distress.  Skin: Skin is warm and dry. No rash noted.   Cardiovascular: Normal heart rate noted  Respiratory: Normal respiratory effort, no problems with respiration noted  Abdomen: Soft, gravid, appropriate for gestational age. Pain/Pressure: Present     Pelvic: Vag. Bleeding: None Vag D/C Character: Mucous   Cervical exam performed Dilation: 2.5 Effacement (%): 70 Station: -3  Extremities: Normal range of motion.  Edema: Trace  Mental Status: Normal mood and affect. Normal behavior. Normal judgment and thought content.   Urinalysis:      Assessment and Plan:  Pregnancy: G2P1001 at 6048w0d  1. Supervision of normal first pregnancy, antepartum   2. Post dates pregnancy - NST reactive, AFI 13 - membranes swept, tolerated well - IOL in 4 days if no labor - discussed waterbirth would not be an option if need IOL d/t need for continuous EFM    Term labor symptoms and general obstetric precautions including but  not limited to vaginal bleeding, contractions, leaking of fluid and fetal movement were reviewed in detail with the patient. Please refer to After Visit Summary for other counseling recommendations.  No follow-ups on file.   Donette LarryBhambri, Caven Perine, CNM

## 2017-11-11 ENCOUNTER — Other Ambulatory Visit: Payer: Self-pay | Admitting: Family Medicine

## 2017-11-12 ENCOUNTER — Inpatient Hospital Stay (HOSPITAL_COMMUNITY)
Admission: AD | Admit: 2017-11-12 | Discharge: 2017-11-14 | DRG: 807 | Disposition: A | Discharge status: Home / Self Care | Payer: Medicaid Other | Attending: Obstetrics & Gynecology | Admitting: Obstetrics & Gynecology

## 2017-11-12 ENCOUNTER — Other Ambulatory Visit: Payer: Self-pay

## 2017-11-12 ENCOUNTER — Encounter (HOSPITAL_COMMUNITY): Payer: Self-pay

## 2017-11-12 DIAGNOSIS — E669 Obesity, unspecified: Secondary | ICD-10-CM | POA: Diagnosis present

## 2017-11-12 DIAGNOSIS — Z3400 Encounter for supervision of normal first pregnancy, unspecified trimester: Secondary | ICD-10-CM

## 2017-11-12 DIAGNOSIS — O26843 Uterine size-date discrepancy, third trimester: Secondary | ICD-10-CM | POA: Diagnosis present

## 2017-11-12 DIAGNOSIS — Z3483 Encounter for supervision of other normal pregnancy, third trimester: Secondary | ICD-10-CM | POA: Diagnosis present

## 2017-11-12 DIAGNOSIS — Z3A41 41 weeks gestation of pregnancy: Secondary | ICD-10-CM

## 2017-11-12 DIAGNOSIS — O99210 Obesity complicating pregnancy, unspecified trimester: Secondary | ICD-10-CM

## 2017-11-12 DIAGNOSIS — O99214 Obesity complicating childbirth: Secondary | ICD-10-CM | POA: Diagnosis present

## 2017-11-12 DIAGNOSIS — O480 Post-term pregnancy: Secondary | ICD-10-CM

## 2017-11-12 LAB — CBC
HEMATOCRIT: 29.6 % — AB (ref 36.0–46.0)
HEMOGLOBIN: 9.2 g/dL — AB (ref 12.0–15.0)
MCH: 25.9 pg — ABNORMAL LOW (ref 26.0–34.0)
MCHC: 31.1 g/dL (ref 30.0–36.0)
MCV: 83.4 fL (ref 78.0–100.0)
Platelets: 242 10*3/uL (ref 150–400)
RBC: 3.55 MIL/uL — AB (ref 3.87–5.11)
RDW: 17.8 % — ABNORMAL HIGH (ref 11.5–15.5)
WBC: 8.4 10*3/uL (ref 4.0–10.5)

## 2017-11-12 LAB — TYPE AND SCREEN
ABO/RH(D): O POS
ANTIBODY SCREEN: NEGATIVE

## 2017-11-12 LAB — ABO/RH: ABO/RH(D): O POS

## 2017-11-12 MED ORDER — ACETAMINOPHEN 325 MG PO TABS
650.0000 mg | ORAL_TABLET | ORAL | Status: DC | PRN
  Administered 2017-11-12: 650 mg via ORAL
  Filled 2017-11-12: qty 2

## 2017-11-12 MED ORDER — ZOLPIDEM TARTRATE 5 MG PO TABS: 5.0000 mg | ORAL_TABLET | Freq: Every evening | ORAL | Status: DC | PRN

## 2017-11-12 MED ORDER — OXYCODONE-ACETAMINOPHEN 5-325 MG PO TABS: 2.0000 | ORAL_TABLET | ORAL | Status: DC | PRN

## 2017-11-12 MED ORDER — ONDANSETRON HCL 4 MG/2ML IJ SOLN: 4.0000 mg | INTRAMUSCULAR | Status: DC | PRN

## 2017-11-12 MED ORDER — LIDOCAINE HCL (PF) 1 % IJ SOLN
INTRAMUSCULAR | Status: AC
  Filled 2017-11-12: qty 30

## 2017-11-12 MED ORDER — TETANUS-DIPHTH-ACELL PERTUSSIS 5-2.5-18.5 LF-MCG/0.5 IM SUSP: 0.5000 mL | Freq: Once | INTRAMUSCULAR | Status: DC

## 2017-11-12 MED ORDER — WITCH HAZEL-GLYCERIN EX PADS: 1.0000 "application " | MEDICATED_PAD | CUTANEOUS | Status: DC | PRN

## 2017-11-12 MED ORDER — SOD CITRATE-CITRIC ACID 500-334 MG/5ML PO SOLN: 30.0000 mL | ORAL | Status: DC | PRN

## 2017-11-12 MED ORDER — DIPHENHYDRAMINE HCL 25 MG PO CAPS
25.0000 mg | ORAL_CAPSULE | Freq: Four times a day (QID) | ORAL | Status: DC | PRN
Start: 2017-11-12 — End: 2017-11-14

## 2017-11-12 MED ORDER — SENNOSIDES-DOCUSATE SODIUM 8.6-50 MG PO TABS
2.0000 | ORAL_TABLET | ORAL | Status: DC
  Administered 2017-11-12 – 2017-11-13 (×2): 2 via ORAL
  Filled 2017-11-12 (×2): qty 2

## 2017-11-12 MED ORDER — OXYTOCIN 40 UNITS IN LACTATED RINGERS INFUSION - SIMPLE MED: 2.5000 [IU]/h | INTRAVENOUS | Status: DC

## 2017-11-12 MED ORDER — OXYCODONE-ACETAMINOPHEN 5-325 MG PO TABS: 1.0000 | ORAL_TABLET | ORAL | Status: DC | PRN

## 2017-11-12 MED ORDER — PRENATAL MULTIVITAMIN CH
1.0000 | ORAL_TABLET | Freq: Every day | ORAL | Status: DC
  Administered 2017-11-12 – 2017-11-14 (×3): 1 via ORAL
  Filled 2017-11-12 (×3): qty 1

## 2017-11-12 MED ORDER — IBUPROFEN 600 MG PO TABS
600.0000 mg | ORAL_TABLET | Freq: Four times a day (QID) | ORAL | Status: DC
  Administered 2017-11-12 – 2017-11-14 (×10): 600 mg via ORAL
  Filled 2017-11-12 (×10): qty 1

## 2017-11-12 MED ORDER — ONDANSETRON HCL 4 MG/2ML IJ SOLN: 4.0000 mg | Freq: Four times a day (QID) | INTRAMUSCULAR | Status: DC | PRN

## 2017-11-12 MED ORDER — OXYTOCIN 10 UNIT/ML IJ SOLN
INTRAMUSCULAR | Status: AC
  Filled 2017-11-12: qty 1

## 2017-11-12 MED ORDER — OXYTOCIN BOLUS FROM INFUSION: 500.0000 mL | Freq: Once | INTRAVENOUS | Status: DC

## 2017-11-12 MED ORDER — LIDOCAINE HCL (PF) 1 % IJ SOLN
30.0000 mL | INTRAMUSCULAR | Status: DC | PRN
  Filled 2017-11-12: qty 30

## 2017-11-12 MED ORDER — ACETAMINOPHEN 325 MG PO TABS: 650.0000 mg | ORAL_TABLET | ORAL | Status: DC | PRN

## 2017-11-12 MED ORDER — BENZOCAINE-MENTHOL 20-0.5 % EX AERO: 1.0000 "application " | INHALATION_SPRAY | CUTANEOUS | Status: DC | PRN

## 2017-11-12 MED ORDER — DIBUCAINE 1 % RE OINT: 1.0000 "application " | TOPICAL_OINTMENT | RECTAL | Status: DC | PRN

## 2017-11-12 MED ORDER — SIMETHICONE 80 MG PO CHEW
80.0000 mg | CHEWABLE_TABLET | ORAL | Status: DC | PRN
Start: 2017-11-12 — End: 2017-11-14

## 2017-11-12 MED ORDER — ONDANSETRON HCL 4 MG PO TABS: 4.0000 mg | ORAL_TABLET | ORAL | Status: DC | PRN

## 2017-11-12 MED ORDER — COCONUT OIL OIL
1.0000 "application " | TOPICAL_OIL | Status: DC | PRN
  Administered 2017-11-13: 1 via TOPICAL
  Filled 2017-11-12: qty 120

## 2017-11-12 NOTE — Lactation Note (Signed)
This note was copied from a baby's chart. Lactation Consultation Note  Patient Name: Evelyn Sutton ZOXWR'UToday's Date: 11/12/2017 Reason for consult: Follow-up assessment  Mom called out for assistance with latch.  Baby already on in cradle hold with Mom laid back.  Baby's body facing ceiling and head turned.  Baby latched onto nipple base with flanged lips. Broke suction and took baby off, nipple looked mishapen. Breast massage and hand expression reviewed.  Mom has easy flow of colostrum. Positioned Mom for football hold on left breast.  Baby opening her mouth, but quickly closing it, making it difficulty to time when to bring her on.  Mom needing a lot of guidance on how to support breast, and support baby's head to control the latch. After a couple attempts, baby able to attain a deep areolar grasp.  Mom denies feeling pinching.  Took baby off to check, and nipple rounded and pulled out.  Swallowing identified for Mom.  Mom taught how to use alternate breast compression to increase milk transfer. Mom feels much better about latch.   Due to jaundice, recommended Mom hand express into spoon after baby feeds, to offer extra colostrum to baby.   No void or stool yet.  Consult Status Consult Status: Follow-up Date: 11/13/17 Follow-up type: In-patient    Judee ClaraSmith, Astrid Vides E 11/12/2017, 3:20 PM

## 2017-11-12 NOTE — Lactation Note (Signed)
This note was copied from a baby's chart. Lactation Consultation Note  Patient Name: Evelyn Sutton Reason for consult: Initial assessment;Hyperbilirubinemia;Term;1st time breastfeeding  Visited with P2 Mom of term baby at 709 hrs old.  Double phototherapy started at 3 hrs of age, +DAT and hyperbilirubinemia. Mom has latched baby and fed her 4 times with latch scores of 7-10.  Mom states she feels pinching when baby is latched. Reviewed basics.   Encouraged Mom to call for assistance at next feeding.  Latch needs to be assessed as pinching feeling not normal.  Encouraged STS (double phototherapy), but Mom holding baby on her chest, and feeding baby when she cues she is hungry, waking her at 3 hrs if she is sleeping.  Lactation brochure left in room.  Mom aware of IP and OP lactation services available to her.    Consult Status Consult Status: Follow-up Date: 11/12/17 Follow-up type: In-patient    Evelyn Sutton, Evelyn Sutton Sutton, 2:13 PM

## 2017-11-12 NOTE — MAU Note (Addendum)
Pt states that she started having ctx's at 2230.   Pt reports bloody show.   Pt does not think her water broke, but reports some discharge

## 2017-11-12 NOTE — H&P (Signed)
Obstetrics Admission History & Physical   Beulah GandyKalah Coffel is a 27 y.o. female G2P1001 at 7070w3d presenting for contractions that started around 1130. Denies vaginal bleeding, vaginal discharge, dysuria, LOF. Endorses fetal movement.  Pregnancy has been complicated by obesity, EFW >90%  [facility].  OB History    Gravida  2   Para  1   Term  1   Preterm      AB      Living  1     SAB      TAB      Ectopic      Multiple      Live Births  1        Obstetric Comments  homebirth       History reviewed. No pertinent past medical history. History reviewed. No pertinent surgical history. Family History: family history includes Diabetes in her father and maternal grandmother; Heart disease in her father; Hypertension in her father and mother; Liver cancer in her maternal grandmother; Lung cancer in her maternal grandmother. Social History:  reports that she has never smoked. She has never used smokeless tobacco. She reports that she drank alcohol. She reports that she does not use drugs.     Maternal Diabetes: No Genetic Screening: Normal Maternal Ultrasounds/Referrals: Normal, EFW >90% Fetal Ultrasounds or other Referrals:  None Maternal Substance Abuse:  No Significant Maternal Medications:  None Significant Maternal Lab Results:  None Other Comments:  None  ROS as above  Dilation: 5.5 Effacement (%): 90, 80 Station: -2 Exam by:: Judithe ModestBridget Mosca, RN   Blood pressure 136/83, pulse 79, temperature 98.4 F (36.9 C), resp. rate 16, last menstrual period 01/26/2017, SpO2 100 %.  Physical Exam   Prenatal labs: ABO, Rh:  O positive Antibody:  negative Rubella:  immune RPR: NON-REACTIVE (06/14 0906)  HBsAg:   non-reactive HIV: NON-REACTIVE (06/14 0906)  GBS:   negative   Assessment/Plan: Admit to L&D for SOL Patient desires water birth  Routine intrapartum care. Method of infant feeding: breast Contraception:  undecided   ------ Candis SchatzPatricia Dell, DO Family Medicine, PGY-3  OB FELLOW HISTORY AND PHYSICAL ATTESTATION  I have seen and examined this patient; I agree with above documentation in the resident's note.  Beulah GandyKalah Longan is 27 y.o. G2P1001 female at 3870w3d who presents for SOL. Membranes intact. Ctx started around 10:30 pm on 8/14 and became stronger at 1 am. Pregnancy uncomplicated.  GBS neg. Steward DroneVeronica Rogers, CNM on for water birth call. Will plan for intermittent monitoring q30 min with doppler during ctx. Patient having girl, plans to breast feed, undecided contraception method.     Marcy Sirenatherine Ameya Kutz, D.O. OB Fellow  11/12/2017, 3:25 AM

## 2017-11-13 ENCOUNTER — Inpatient Hospital Stay (HOSPITAL_COMMUNITY): Admission: RE | Admit: 2017-11-13 | Payer: Medicaid Other | Source: Ambulatory Visit

## 2017-11-13 LAB — RPR: RPR Ser Ql: NONREACTIVE

## 2017-11-13 NOTE — Lactation Note (Signed)
This note was copied from a baby's chart. Lactation Consultation Note  Patient Name: Evelyn Beulah GandyKalah Czaja ZOXWR'UToday's Date: 11/13/2017 Reason for consult: Follow-up assessment;Term;Hyperbilirubinemia   Follow up with mom of 33 hour old infant. Infant with 7 BF for 10-60 minutes, 3 BF attempts, EBM x 2 of 2-5 cc, 2 voids, 5 stools, and 1 emesis in the last 24 hours. Infant weight 9 pounds 3.8 ounces with weight loss of 2% since birth. Infant asleep under triple phototherapy. Mom reports she plans to wake her up in the next 30 minutes.   Mom reports she feels she and infant are getting the hang of BF. She reports once infant latched well there is not pain with feeding.   Mom pumped 25 cc colostrum earlier, infant took 5 cc and mom to offer the rest after next BF. Reviewed pump settings with mom, mom pleased with how much she was able to pump. Reviewed pumping about every 2-3 hours post BF to use milk as supplement for infant.   Enc mom to offer breast with feeding cues with no longer that 3 hours between feedings. Mom to awaken infant as needed to feed. Enc mom to follow BF with pumped breast milk. Follow supplementation with pumping for 15 minutes on initiate setting and then follow with hand expression. Discussed importance of infant getting all EBM that is pumped and if parents cannot get infant to take it to call out for assistance. Plan written on board for parents.  Mom reports she has no questions/concerns at this time. Mom to call out if she needs assistance with feeding.      Maternal Data Formula Feeding for Exclusion: No Has patient been taught Hand Expression?: Yes  Feeding Feeding Type: Bottle Fed - Breast Milk  LATCH Score                   Interventions Interventions: DEBP;Breast compression;Breast massage;Hand express;Skin to skin  Lactation Tools Discussed/Used Pump Review: Setup, frequency, and cleaning;Milk Storage Initiated by:: Reviewed and encouraged every  2-3 hours post BF to supplement infant   Consult Status Consult Status: Follow-up Date: 11/14/17 Follow-up type: In-patient    Evelyn FloodSharon S Hice 11/13/2017, 2:33 PM

## 2017-11-13 NOTE — Progress Notes (Signed)
Daily Post Partum Note  11/13/2017 Evelyn Sutton is a 27 y.o. [id number] PPD#1 s/p  SVD/intact perineum @ 9754w3d.  Pregnancy c/b BMI 30s 24hr/overnight events:  None  Subjective:  No issues. No s/s of anemia.  Objective:    Current Vital Signs 24h Vital Sign Ranges  T 98.1 F (36.7 C) Temp  Avg: 98.3 F (36.8 C)  Min: 98.1 F (36.7 C)  Max: 98.7 F (37.1 C)  BP 124/68 BP  Min: 110/62  Max: 130/75  HR 74 Pulse  Avg: 75.3  Min: 68  Max: 84  RR 18 Resp  Avg: 19.3  Min: 18  Max: 20  SaO2 100 %   No data recorded       24 Hour I/O Current Shift I/O  Time Ins Outs No intake/output data recorded. No intake/output data recorded.    General: NAD Abdomen: nttp Perineum: deferred Skin:  Warm and dry.  Cardiovascular: S1, S2 normal, no murmur, rub or gallop, regular rate and rhythm Respiratory:  Clear to auscultation bilateral. Normal respiratory effort  Medications Current Facility-Administered Medications  Medication Dose Route Frequency Provider Last Rate Last Dose  . acetaminophen (TYLENOL) tablet 650 mg  650 mg Oral Q4H PRN Sharyon CableRogers, Veronica C, CNM   650 mg at 11/12/17 [id number]  . benzocaine-Menthol (DERMOPLAST) 20-0.5 % topical spray 1 application  1 application Topical PRN Sharyon Cableogers, Veronica C, CNM      . coconut oil  1 application Topical PRN Sharyon Cableogers, Veronica C, CNM      . witch hazel-glycerin (TUCKS) pad 1 application  1 application Topical PRN Sharyon Cableogers, Veronica C, CNM       And  . dibucaine (NUPERCAINAL) 1 % rectal ointment 1 application  1 application Rectal PRN Sharyon Cableogers, Veronica C, CNM      . diphenhydrAMINE (BENADRYL) capsule 25 mg  25 mg Oral Q6H PRN Sharyon Cableogers, Veronica C, CNM      . ibuprofen (ADVIL,MOTRIN) tablet 600 mg  600 mg Oral Q6H Steward Droneogers, Veronica C, CNM   600 mg at 11/13/17 1142  . ondansetron (ZOFRAN) tablet 4 mg  4 mg Oral Q4H PRN Sharyon Cableogers, Veronica C, CNM       Or  . ondansetron [facility](ZOFRAN) injection 4 mg  4 mg Intravenous Q4H PRN Sharyon Cableogers, Veronica C, CNM      . prenatal  multivitamin tablet 1 tablet  1 tablet Oral Q1200 Sharyon Cableogers, Veronica C, CNM   1 tablet at 11/13/17 1142  . senna-docusate (Senokot-S) tablet 2 tablet  2 tablet Oral Q24H Sharyon Cableogers, Veronica C, CNM   2 tablet at 11/12/17 2329  . simethicone (MYLICON) chewable tablet 80 mg  80 mg Oral PRN Sharyon Cableogers, Veronica C, CNM      . Tdap (BOOSTRIX) injection 0.5 mL  0.5 mL Intramuscular Once Sharyon Cableogers, Veronica C, CNM      . zolpidem (AMBIEN) tablet 5 mg  5 mg Oral QHS PRN Sharyon Cableogers, Veronica C, CNM        Labs:  Recent Labs  Lab 11/12/17 0314  WBC 8.4  HGB 9.2*  HCT 29.6*  PLT 242   Conflict (See Lab Report): O POS/O POS Performed at [facility], [address] [place], [place] [address]   Assessment & Plan:  Pt doing well *Postpartum/postop: routine care. Baby under bili lights. Breast. [facility] *Dispo: likely tomorrow.   Cornelia Copaharlie Bianka Liberati, Jr. MD Attending Center for [facility](Faculty Practice)

## 2017-11-14 MED ORDER — IBUPROFEN 600 MG PO TABS: 600.0000 mg | ORAL_TABLET | Freq: Four times a day (QID) | ORAL | 0 refills | Status: DC | PRN

## 2017-11-14 NOTE — Discharge Summary (Signed)
OB Discharge Summary     Patient Name: Evelyn Sutton DOB: 06-Feb-1991 MRN: [id number]  Date of admission: 11/12/2017 Delivering MD: Sharyon CableOGERS, VERONICA C   Date of discharge: 11/14/2017  Admitting diagnosis: 41 WEEKS CTX Intrauterine pregnancy: 562w3d     Secondary diagnosis:  Active Problems:   Supervision of normal first pregnancy, antepartum   Obesity in pregnancy   Uterine size date discrepancy pregnancy, third trimester   SVD (spontaneous vaginal delivery)   Normal labor  Additional problems: none     Discharge diagnosis: Term Pregnancy Delivered                                                                                                Post partum procedures:none  Augmentation: none  Complications: None  Hospital course:  Onset of Labor With Vaginal Delivery     27 y.o. yo [id number] at 462w3d was admitted in Active Labor on 11/12/2017. Patient had an uncomplicated labor course and delivered precipitously in the tub.  Membrane Rupture Time/Date: 4:27 AM ,11/12/2017   Intrapartum Procedures: Episiotomy: None [1]                                         Lacerations:  None [1]  Patient had a delivery of a Viable infant. 11/12/2017  Information for the patient's newborn:  Saundra ShellingMatthews, Girl Takeesha [[id number]][[id number]]  Delivery Method: Vaginal, Spontaneous(Filed from Delivery Summary)    Pateint had an uncomplicated postpartum course.  She is ambulating, tolerating a regular diet, passing flatus, and urinating well. Patient is discharged home in stable condition on 11/14/17.   Physical exam  Vitals:   11/13/17 0557 11/13/17 1524 11/13/17 2316 11/14/17 0512  BP: 124/68 123/83 129/84 121/80  Pulse: 74 69 90 79  Resp: 18 18  18   Temp: 98.1 F (36.7 C) 98.2 F (36.8 C) 98.4 F (36.9 C) 98.2 F (36.8 C)  TempSrc: Oral Oral Oral   SpO2:  99% 100%    General: alert and cooperative Lochia: appropriate Uterine Fundus: firm Incision: N/A DVT Evaluation: No evidence of DVT seen on  physical exam. Labs: Lab Results  Component Value Date   WBC 8.4 11/12/2017   HGB 9.2 (L) 11/12/2017   HCT 29.6 (L) 11/12/2017   MCV 83.4 11/12/2017   PLT 242 11/12/2017   No flowsheet data found.  Discharge instruction: per After Visit Summary and "Baby and Me Booklet".  After visit meds:  Allergies as of 11/14/2017   No Known Allergies     Medication List    STOP taking these medications   esomeprazole 20 MG capsule Commonly known as:  NEXIUM     TAKE these medications   CONCEPT DHA 53.5-38-1 MG Caps Take 1 tablet by mouth daily.   docusate sodium 100 MG capsule Commonly known as:  COLACE Take 1 capsule (100 mg total) by mouth 2 (two) times daily as needed.   ibuprofen 600 MG tablet Commonly known as:  ADVIL,MOTRIN Take 1 tablet (600 mg total) by mouth  every 6 (six) hours as needed.       Diet: routine diet  Activity: Advance as tolerated. Pelvic rest for 6 weeks.   Outpatient follow up:4 weeks Follow up Appt:No future appointments. Follow up Visit:No follow-ups on file.  Postpartum contraception: Undecided  Newborn Data: Live born female  Birth Weight: 9 lb 7.5 oz (4295 g) APGAR: 1, 8  Newborn Delivery   Birth date/time:  11/12/2017 04:37:00 Delivery type:  Vaginal, Spontaneous     Baby Feeding: Breast Disposition:baby under phototherapy- dispo per peds   11/14/2017 Cam HaiSHAW, KIMBERLY, CNM  9:39 AM

## 2017-11-14 NOTE — Discharge Instructions (Signed)
Vaginal Delivery, Care After °Refer to this sheet in the next few weeks. These instructions provide you with information about caring for yourself after vaginal delivery. Your health care provider may also give you more specific instructions. Your treatment has been planned according to current medical practices, but problems sometimes occur. Call your health care provider if you have any problems or questions. °What can I expect after the procedure? °After vaginal delivery, it is common to have: °· Some bleeding from your vagina. °· Soreness in your abdomen, your vagina, and the area of skin between your vaginal opening and your anus (perineum). °· Pelvic cramps. °· Fatigue. ° °Follow these instructions at home: °Medicines °· Take over-the-counter and prescription medicines only as told by your health care provider. °· If you were prescribed an antibiotic medicine, take it as told by your health care provider. Do not stop taking the antibiotic until it is finished. °Driving ° °· Do not drive or operate heavy machinery while taking prescription pain medicine. °· Do not drive for 24 hours if you received a sedative. °Lifestyle °· Do not drink alcohol. This is especially important if you are breastfeeding or taking medicine to relieve pain. °· Do not use tobacco products, including cigarettes, chewing tobacco, or e-cigarettes. If you need help quitting, ask your health care provider. °Eating and drinking °· Drink at least 8 eight-ounce glasses of water every day unless you are told not to by your health care provider. If you choose to breastfeed your baby, you may need to drink more water than this. °· Eat high-fiber foods every day. These foods may help prevent or relieve constipation. High-fiber foods include: °? Whole grain cereals and breads. °? Brown rice. °? Beans. °? Fresh fruits and vegetables. °Activity °· Return to your normal activities as told by your health care provider. Ask your health care provider  what activities are safe for you. °· Rest as much as possible. Try to rest or take a nap when your baby is sleeping. °· Do not lift anything that is heavier than your baby or 10 lb (4.5 kg) until your health care provider says that it is safe. °· Talk with your health care provider about when you can engage in sexual activity. This may depend on your: °? Risk of infection. °? Rate of healing. °? Comfort and desire to engage in sexual activity. °Vaginal Care °· If you have an episiotomy or a vaginal tear, check the area every day for signs of infection. Check for: °? More redness, swelling, or pain. °? More fluid or blood. °? Warmth. °? Pus or a bad smell. °· Do not use tampons or douches until your health care provider says this is safe. °· Watch for any blood clots that may pass from your vagina. These may look like clumps of dark red, brown, or black discharge. °General instructions °· Keep your perineum clean and dry as told by your health care provider. °· Wear loose, comfortable clothing. °· Wipe from front to back when you use the toilet. °· Ask your health care provider if you can shower or take a bath. If you had an episiotomy or a perineal tear during labor and delivery, your health care provider may tell you not to take baths for a certain length of time. °· Wear a bra that supports your breasts and fits you well. °· If possible, have someone help you with household activities and help care for your baby for at least a few days after   you leave the hospital. °· Keep all follow-up visits for you and your baby as told by your health care provider. This is important. °Contact a health care provider if: °· You have: °? Vaginal discharge that has a bad smell. °? Difficulty urinating. °? Pain when urinating. °? A sudden increase or decrease in the frequency of your bowel movements. °? More redness, swelling, or pain around your episiotomy or vaginal tear. °? More fluid or blood coming from your episiotomy or  vaginal tear. °? Pus or a bad smell coming from your episiotomy or vaginal tear. °? A fever. °? A rash. °? Little or no interest in activities you used to enjoy. °? Questions about caring for yourself or your baby. °· Your episiotomy or vaginal tear feels warm to the touch. °· Your episiotomy or vaginal tear is separating or does not appear to be healing. °· Your breasts are painful, hard, or turn red. °· You feel unusually sad or worried. °· You feel nauseous or you vomit. °· You pass large blood clots from your vagina. If you pass a blood clot from your vagina, save it to show to your health care provider. Do not flush blood clots down the toilet without having your health care provider look at them. °· You urinate more than usual. °· You are dizzy or light-headed. °· You have not breastfed at all and you have not had a menstrual period for 12 weeks after delivery. °· You have stopped breastfeeding and you have not had a menstrual period for 12 weeks after you stopped breastfeeding. °Get help right away if: °· You have: °? Pain that does not go away or does not get better with medicine. °? Chest pain. °? Difficulty breathing. °? Blurred vision or spots in your vision. °? Thoughts about hurting yourself or your baby. °· You develop pain in your abdomen or in one of your legs. °· You develop a severe headache. °· You faint. °· You bleed from your vagina so much that you fill two sanitary pads in one hour. °This information is not intended to replace advice given to you by your health care provider. Make sure you discuss any questions you have with your health care provider. °Document Released: 03/14/2000 Document Revised: 08/29/2015 Document Reviewed: 04/01/2015 °Elsevier Interactive Patient Education © 2018 [employer] ° °

## 2017-11-14 NOTE — Lactation Note (Signed)
This note was copied from a baby's chart. Lactation Consultation Note  Patient Name: Girl Beulah GandyKalah Delange ZOXWR'UToday's Date: 11/14/2017 Reason for consult: Follow-up assessment;Hyperbilirubinemia Baby remains under triple phototherapy.  Mom is pumping every 3 hours and obtaining 30-40 mls from each breast of transitional milk.  Baby is taking expressed milk per bottle.  If bank light is discontinued later today mom will call out for latch assist.  Maternal Data    Feeding Nipple Type: Slow - flow  LATCH Score                   Interventions    Lactation Tools Discussed/Used     Consult Status Consult Status: Follow-up Date: 11/15/17 Follow-up type: In-patient    Huston FoleyMOULDEN, Tagen Milby S 11/14/2017, 1:28 PM

## 2017-11-15 ENCOUNTER — Ambulatory Visit: Payer: Self-pay

## 2017-11-15 NOTE — Lactation Note (Signed)
This note was copied from a baby's chart. Lactation Consultation Note  Patient Name: Evelyn Beulah GandyKalah Mcphie MVHQI'OToday's Date: 11/15/2017 Reason for consult: Follow-up assessment;Term;Hyperbilirubinemia MD is allowing baby to come off light for feedings.  Mom would like to get baby back to breast.  Positioned baby skin to skin in football hold.  Rolled cloth placed under breast for support.  Milk easily hand expressed prior to latch.  Baby opened wide and latched easily to breast.  Good depth achieved.  Baby swallowing after each suck.  Breast massaged during feeding.  Mom will post pump.  Instructed to feed with cues and call for assist prn.  Maternal Data    Feeding Feeding Type: Breast Fed  LATCH Score Latch: Grasps breast easily, tongue down, lips flanged, rhythmical sucking.  Audible Swallowing: Spontaneous and intermittent  Type of Nipple: Everted at rest and after stimulation  Comfort (Breast/Nipple): Soft / non-tender  Hold (Positioning): Assistance needed to correctly position infant at breast and maintain latch.  LATCH Score: 9  Interventions Interventions: Assisted with latch;Breast compression;Skin to skin;Adjust position;Breast massage;Support pillows;Hand express;DEBP  Lactation Tools Discussed/Used     Consult Status Consult Status: Follow-up Date: 11/16/17 Follow-up type: In-patient    Huston FoleyMOULDEN, Jarred Purtee S 11/15/2017, 9:40 AM

## 2017-12-10 ENCOUNTER — Encounter: Payer: Self-pay | Admitting: Obstetrics & Gynecology

## 2017-12-10 ENCOUNTER — Ambulatory Visit (INDEPENDENT_AMBULATORY_CARE_PROVIDER_SITE_OTHER): Payer: Medicaid Other | Admitting: Obstetrics & Gynecology

## 2017-12-10 DIAGNOSIS — Z23 Encounter for immunization: Secondary | ICD-10-CM

## 2017-12-10 DIAGNOSIS — Z1389 Encounter for screening for other disorder: Secondary | ICD-10-CM | POA: Diagnosis not present

## 2017-12-10 NOTE — Progress Notes (Signed)
Post Partum Exam  Evelyn Sutton is a 27 y.o. 442P2002 female who presents for a postpartum visit. She is 4 weeks postpartum following a spontaneous vaginal delivery. I have fully reviewed the prenatal and intrapartum course. The delivery was at 3524w3d gestational weeks.  Anesthesia: none. Postpartum course has been unremarkable. Baby's course has been unremarkable. Baby is feeding by breast. Bleeding pink. Bowel function is normal. Bladder function is normal. Patient is not sexually active. Contraception method is undecided. Postpartum depression screening:neg  The following portions of the patient's history were reviewed and updated as appropriate: allergies, current medications, past family history, past medical history, past social history, past surgical history and problem list. Last pap smear done 11/18 in [place] and was Normal  Review of Systems Pertinent items are noted in HPI.    Objective:  Blood pressure 130/86, pulse (!) 111, resp. rate 16, height 5\' 5"  (1.651 m), weight 214 lb (97.1 kg), currently breastfeeding.  General:  alert   Breasts:  inspection negative, no nipple discharge or bleeding, no masses or nodularity palpable  Lungs: clear to auscultation bilaterally  Heart:  regular rate and rhythm, S1, S2 normal, no murmur, click, rub or gallop  Abdomen: soft, non-tender; bowel sounds normal; no masses,  no organomegaly   Vulva:  normal  Vagina:  normal  Cervix:  not evaluated  Corpus: not examined  Adnexa:  not evaluated  Rectal Exam: Not performed.        Assessment:    Normal postpartum exam. Pap smear not done at today's visit.   Plan:   1. Contraception: She may want another IUD in the future, still undecided today 2. Flu vaccine today

## 2018-04-20 ENCOUNTER — Telehealth: Payer: Self-pay | Admitting: *Deleted

## 2018-04-20 ENCOUNTER — Ambulatory Visit: Payer: Medicaid Other | Admitting: Certified Nurse Midwife

## 2018-04-20 NOTE — Telephone Encounter (Signed)
Left patient a message to call and reschedule her missed IUD appointment on 04/20/2018 at 9:15am.

## 2019-01-10 ENCOUNTER — Encounter: Payer: Self-pay | Admitting: *Deleted

## 2019-01-10 NOTE — Progress Notes (Signed)
e

## 2019-01-12 ENCOUNTER — Other Ambulatory Visit: Payer: Self-pay

## 2019-01-12 ENCOUNTER — Encounter: Payer: Self-pay | Admitting: Obstetrics and Gynecology

## 2019-01-12 ENCOUNTER — Other Ambulatory Visit: Payer: Medicaid Other

## 2019-01-12 ENCOUNTER — Ambulatory Visit (INDEPENDENT_AMBULATORY_CARE_PROVIDER_SITE_OTHER): Payer: Medicaid Other | Admitting: Obstetrics and Gynecology

## 2019-01-12 ENCOUNTER — Other Ambulatory Visit (HOSPITAL_COMMUNITY)
Admission: RE | Admit: 2019-01-12 | Discharge: 2019-01-12 | Disposition: A | Discharge status: Home / Self Care | Payer: Medicaid Other | Source: Ambulatory Visit | Attending: Obstetrics and Gynecology | Admitting: Obstetrics and Gynecology

## 2019-01-12 VITALS — BP 114/64 | HR 76 | Wt 212.0 lb

## 2019-01-12 DIAGNOSIS — O99211 Obesity complicating pregnancy, first trimester: Secondary | ICD-10-CM

## 2019-01-12 DIAGNOSIS — Z3480 Encounter for supervision of other normal pregnancy, unspecified trimester: Secondary | ICD-10-CM | POA: Diagnosis not present

## 2019-01-12 DIAGNOSIS — Z3A10 10 weeks gestation of pregnancy: Secondary | ICD-10-CM | POA: Diagnosis not present

## 2019-01-12 DIAGNOSIS — O219 Vomiting of pregnancy, unspecified: Secondary | ICD-10-CM | POA: Diagnosis not present

## 2019-01-12 DIAGNOSIS — O99210 Obesity complicating pregnancy, unspecified trimester: Secondary | ICD-10-CM

## 2019-01-12 MED ORDER — ASPIRIN EC 81 MG PO TBEC: 81.0000 mg | DELAYED_RELEASE_TABLET | Freq: Every day | ORAL | 2 refills | Status: DC

## 2019-01-12 MED ORDER — DOXYLAMINE-PYRIDOXINE 10-10 MG PO TBEC: 2.0000 | DELAYED_RELEASE_TABLET | Freq: Every day | ORAL | 1 refills | Status: DC

## 2019-01-12 NOTE — Progress Notes (Signed)
Last pap in [place] in 2018.  Bedside U/S shows single IUP with CRL measuring 29.17 mm  GA is [redacted]w[redacted]d and FHT measure 181 BPM

## 2019-01-12 NOTE — Progress Notes (Signed)
History:   Rechy Bost is a 28 y.o. G3P2002 at [redacted]w[redacted]d by LMP being seen today for her first obstetrical visit.  Her obstetrical history is significant for obesity. Patient does intend to breast feed. Pregnancy history fully reviewed. Unplanned pregnancy, however very happy.   Patient reports no complaints.  HISTORY: OB History  Gravida Para Term Preterm AB Living  3 2 2  0 0 2  SAB TAB Ectopic Multiple Live Births  0 0 0 0 2    # Outcome Date GA Lbr Len/2nd Weight Sex Delivery Anes PTL Lv  3 Current           2 Term 11/12/17 [redacted]w[redacted]d / 00:07 9 lb 7.5 oz (4.295 kg) F Vag-Spont None  LIV     Name: Mcelwee,GIRL [facility]     Apgar1: 1  Apgar5: 8  1 Term 02/07/06 [redacted]w[redacted]d  8 lb (3.629 kg)  Vag-Spont   LIV    Obstetric Comments  homebirth    Last pap smear was done 01/2017 and was normal  History reviewed. No pertinent past medical history. History reviewed. No pertinent surgical history. Family History  Problem Relation Age of Onset  . Hypertension Mother   . Diabetes Father   . Hypertension Father   . Heart disease Father   . Lung cancer Maternal Grandmother   . Liver cancer Maternal Grandmother   . Diabetes Maternal Grandmother    Social History   Tobacco Use  . Smoking status: Never Smoker  . Smokeless tobacco: Never Used  Substance Use Topics  . Alcohol use: Not Currently  . Drug use: Never   No Known Allergies Current Outpatient Medications on File Prior to Visit  Medication Sig Dispense Refill  . pyridOXINE (VITAMIN B-6) 25 MG tablet Take 25 mg by mouth daily.    Marland Kitchen docusate sodium (COLACE) 100 MG capsule Take 1 capsule (100 mg total) by mouth 2 (two) times daily as needed. (Patient not taking: Reported on 01/12/2019) 30 capsule 2   No current facility-administered medications on file prior to visit.     Review of Systems Pertinent items noted in HPI and remainder of comprehensive ROS otherwise negative. Physical Exam:   Vitals:   01/12/19 0820  BP: 114/64   Pulse: 76  Weight: 212 lb (96.2 kg)   Fetal Heart Rate (bpm): 181 Uterus:     Pelvic Exam: Perineum: no hemorrhoids, normal perineum   Vulva: normal external genitalia, no lesions   Vagina:  normal mucosa, normal discharge   Cervix: no lesions and normal, pap smear done.    Adnexa: normal adnexa and no mass, fullness, tenderness   Bony Pelvis: average  System: General: well-developed, well-nourished female in no acute distress   Breasts:  normal appearance, no masses or tenderness bilaterally   Skin: normal coloration and turgor, no rashes   Neurologic: oriented, normal, negative, normal mood   Extremities: normal strength, tone, and muscle mass, ROM of all joints is normal   HEENT PERRLA, extraocular movement intact and sclera clear, anicteric   Mouth/Teeth mucous membranes moist, pharynx normal without lesions and dental hygiene good   Neck supple and no masses   Cardiovascular: regular rate and rhythm   Respiratory:  no respiratory distress, normal breath sounds   Abdomen: soft, non-tender; bowel sounds normal; no masses,  no organomegaly  Bedside Ultrasound for FHR check: Patient informed that the ultrasound is considered a limited obstetric ultrasound and is not intended to be a complete ultrasound exam.  Patient also informed  that the ultrasound is not being completed with the intent of assessing for fetal or placental anomalies or any pelvic abnormalities.  Explained that the purpose of today's ultrasound is to assess for fetal heart rate.  Patient acknowledges the purpose of the exam and the limitations of the study.     Assessment:    Pregnancy: [id number] Patient Active Problem List   Diagnosis Date Noted  . Supervision of other normal pregnancy, antepartum 01/12/2019  . Nausea and vomiting in pregnancy 01/12/2019  . SVD (spontaneous vaginal delivery) 11/12/2017  . Normal labor 11/12/2017  . Uterine size date discrepancy pregnancy, third trimester 09/21/2017  . Obesity in  pregnancy 09/07/2017     Plan:   1. Supervision of other normal pregnancy, antepartum  - Obstetric panel - HIV antibody (with reflex) - Culture, OB Urine - Hemoglobinopathy Evaluation - Sickle Cell Scr - Babyscripts Schedule Optimization - HgB A1c - Urine cytology ancillary only([place]) - [place] MFM OB COMP + 14 WK; Future  2. Obesity in pregnancy  Discussed starting BASA, RX sent   3. Nausea and vomiting in pregnancy  Rx: Diclegis    Initial labs drawn. Continue prenatal vitamins. Genetic Screening discussed, NIPS: requested. Ultrasound discussed; fetal anatomic survey: requested. Problem list reviewed and updated. The nature of [facility] with multiple MDs and other Advanced Practice Providers was explained to patient; also emphasized that residents, students are part of our team. Routine obstetric precautions reviewed. No follow-ups on file.    Rasch, Harolyn Rutherford, NP Faculty Practice Center for [employer], [facility]

## 2019-01-13 LAB — HEMOGLOBIN A1C
Hgb A1c MFr Bld: 5.2 % of total Hgb (ref <5.7)
Mean Plasma Glucose: 103 (calc)
eAG (mmol/L): 5.7 (calc)

## 2019-01-13 IMAGING — US US MFM OB FOLLOW-UP
1 series · 14 of 28 positions shown · non-contrast
Comparison: none

[Series 1: us mfm ob follow-up · 14 of 30 slices shown]
[im 2/30]
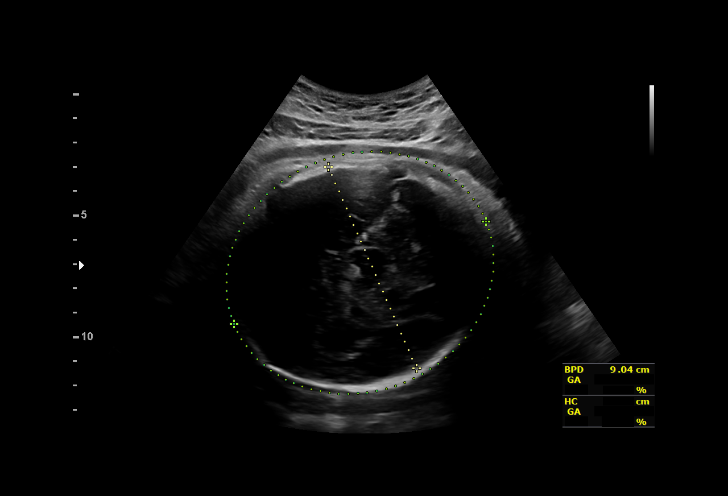
[im 4/30]
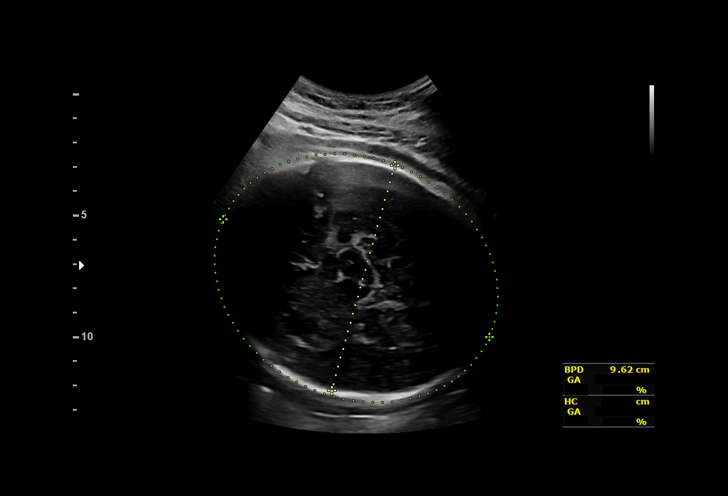
[im 6/30]
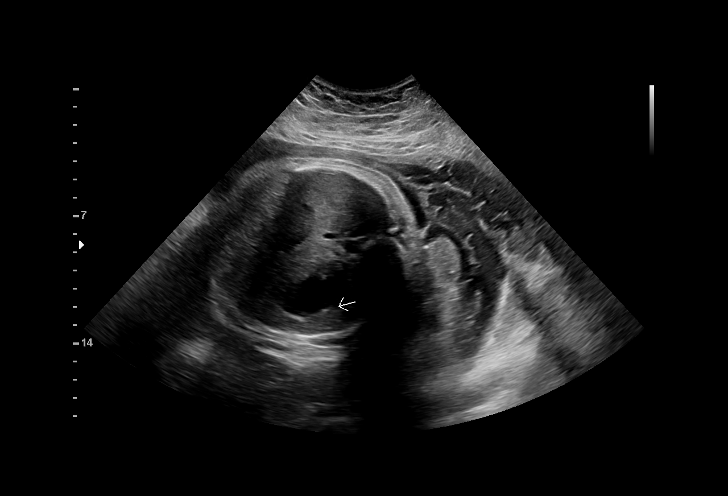
[im 8/30]
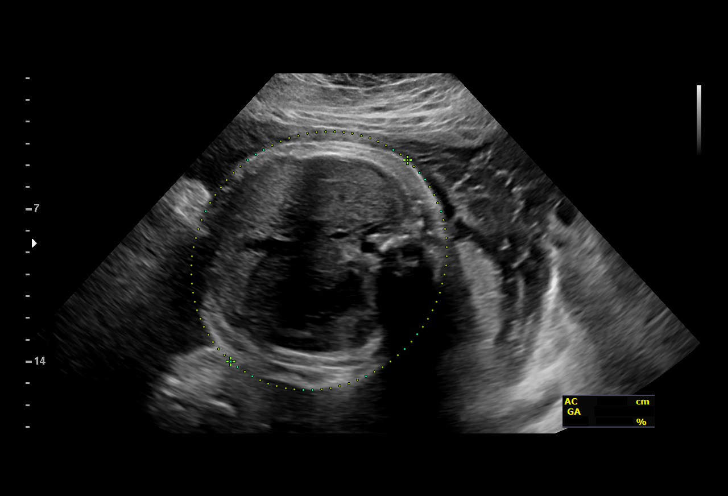
[im 10/30]
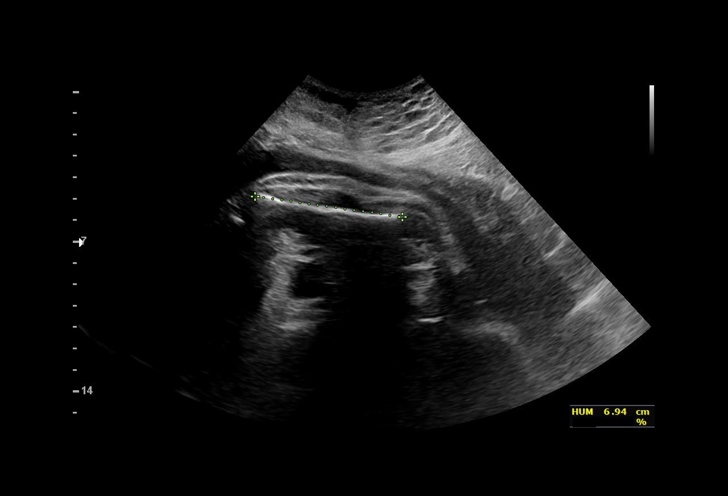
[im 12/30]
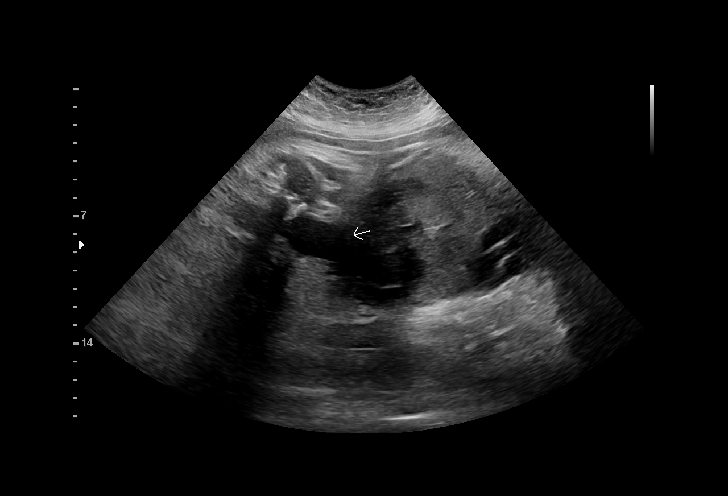
[im 14/30]
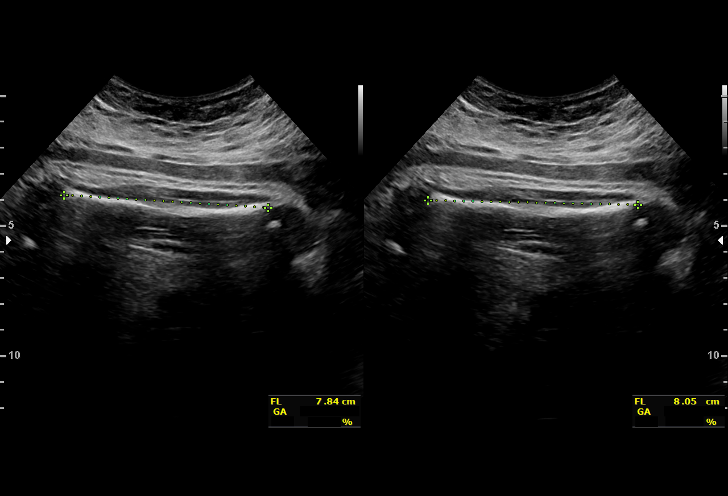
[im 17/30]
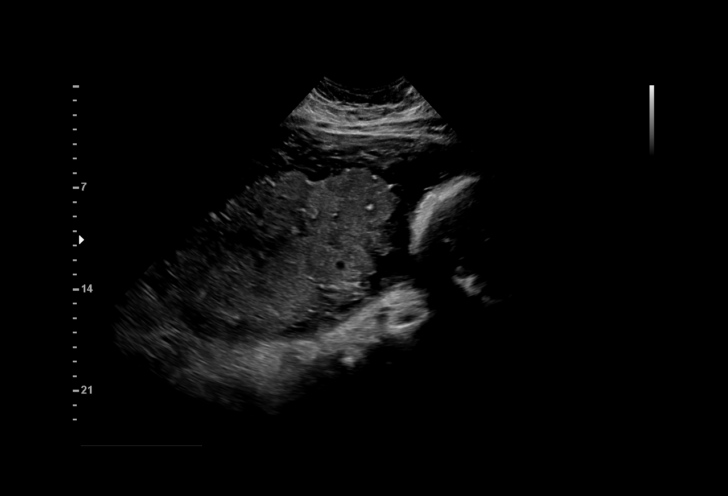
[im 19/30]
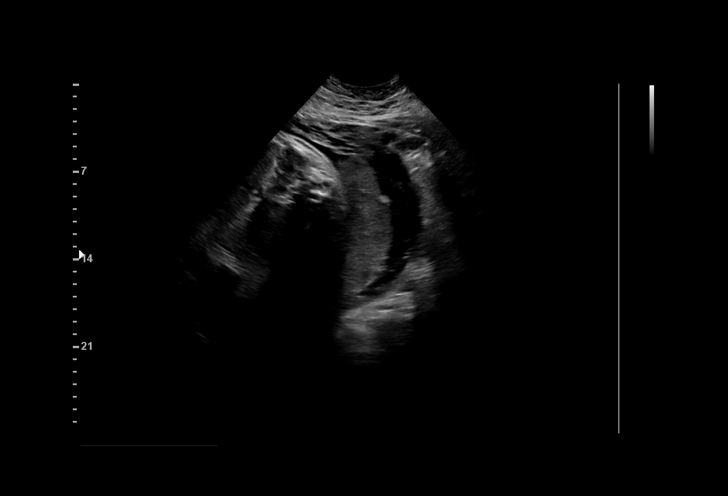
[im 21/30]
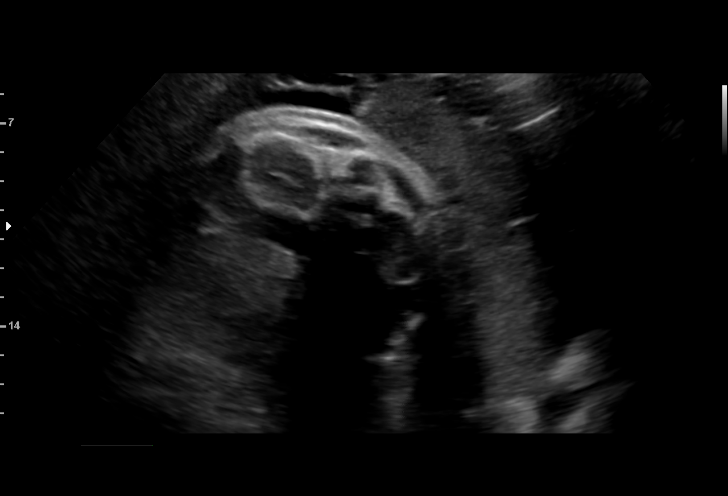
[im 23/30]
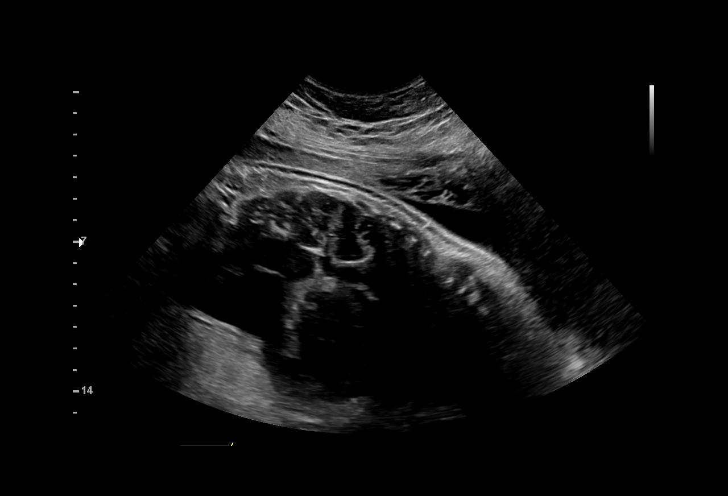
[im 25/30]
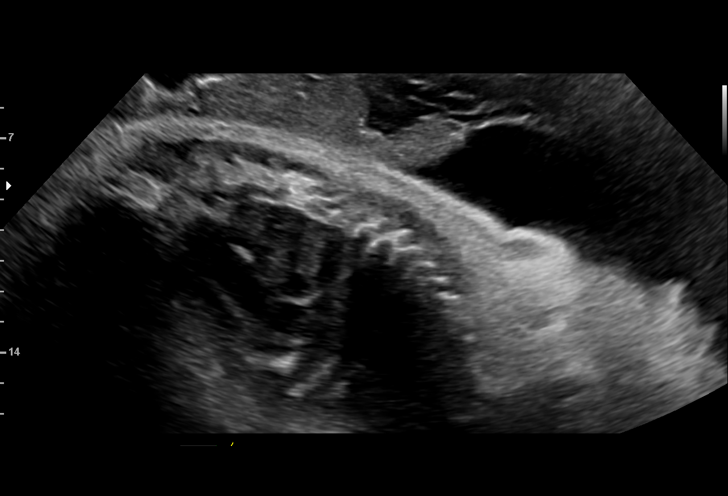
[im 27/30]
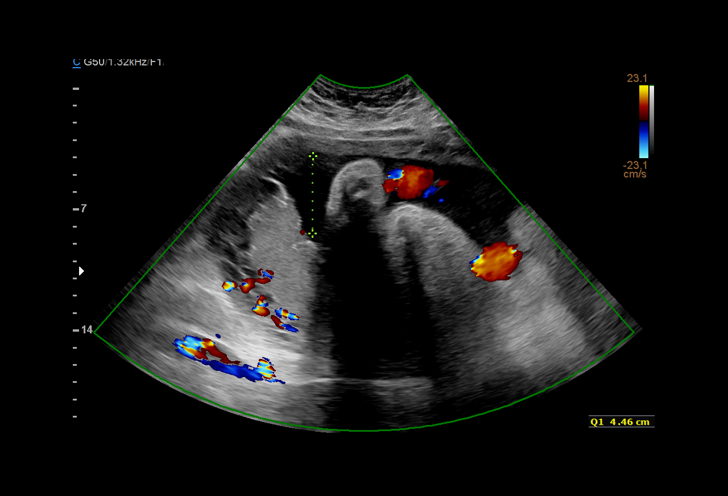
[im 30/30]
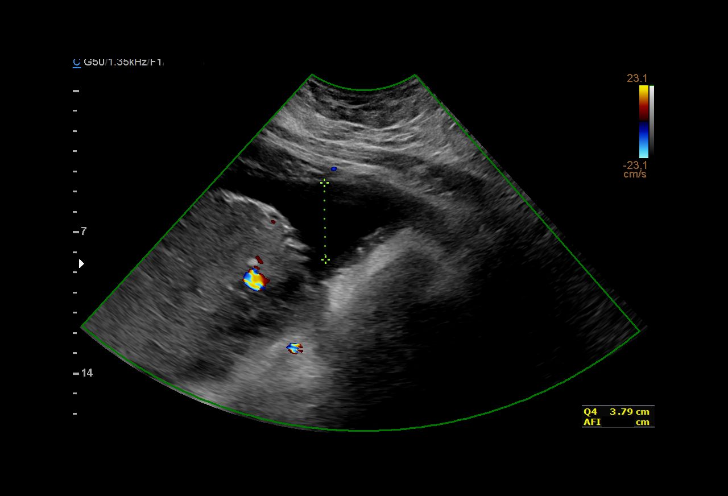

[14 of 28 positions shown; findings below may reference images not displayed]

1  ASWIN KELLER           [id number]      [id number]     [id number]
Indications

37 weeks gestation of pregnancy
Obesity complicating pregnancy, third
trimester
Late to prenatal care, third trimester
Uterine size-date discrepancy, third trimester
OB History

Blood Type:            Height:  5'5"   Weight (lb):  236       BMI:
Gravidity:    2         Term:   1
Fetal Evaluation

Num Of Fetuses:     1
Fetal Heart         161
Rate(bpm):
Cardiac Activity:   Observed
Presentation:       Cephalic
Placenta:           Posterior
P. Cord Insertion:  Previously Visualized

Amniotic Fluid
AFI FV:      Subjectively within normal limits

AFI Sum(cm)     %Tile       Largest Pocket(cm)
14.62           55

RUQ(cm)       RLQ(cm)       LUQ(cm)        LLQ(cm)
4.46
Biometry
BPD:      93.4  mm     G. Age:  38w 0d         80  %    CI:        77.03   %    70 - 86
FL/HC:      23.6   %    20.9 -
HC:       337   mm     G. Age:  38w 4d         53  %    HC/AC:      0.92        0.92 -
AC:       367   mm     G. Age:  40w 4d       > 97  %    FL/BPD:     85.2   %    71 - 87
FL:       79.6  mm     G. Age:  40w 5d       > 97  %    FL/AC:      21.7   %    20 - 24
HUM:      68.3  mm     G. Age:  39w 5d       > 95  %

Est. FW:    9697  gm    8 lb 12 oz    > 90  %
Gestational Age

LMP:           37w 4d        Date:  01/26/17                 EDD:   11/02/17
U/S Today:     39w 3d                                        EDD:   10/20/17
Best:          37w 4d     Det. By:  LMP  (01/26/17)          EDD:   11/02/17
Anatomy

Cranium:               Appears normal         Aortic Arch:            Previously seen
Cavum:                 Previously seen        Ductal Arch:            Not well visualized
Ventricles:            Previously seen        Diaphragm:              Not well visualized
Choroid Plexus:        Previously seen        Stomach:                Appears normal, left
sided
Cerebellum:            Previously seen        Abdomen:                Previously seen
Posterior Fossa:       Not well visualized    Abdominal Wall:         Not well visualized
Nuchal Fold:           Not applicable (>20    Cord Vessels:           Previously seen
wks GA)
Face:                  Orbits and profile     Kidneys:                Appear normal
previously seen
Lips:                  Previously seen        Bladder:                Appears normal
Thoracic:              Appears normal         Spine:                  Ltd views no
intracranial signs of
NT
Heart:                 Appears normal         Upper Extremities:      Lt prev Visualized
(4CH, axis, and situs
RVOT:                  Previously seen        Lower Extremities:      prev Visualized
LVOT:                  Not well visualized

Other:  Female gender prev seen. Technically difficult due to advanced GA
and fetal position.
Cervix Uterus Adnexa

Cervix
Not visualized (advanced GA >28wks)
Recommendations

Follow up as clinically indicated and or scheduled.

## 2019-01-14 LAB — HIV ANTIBODY (ROUTINE TESTING W REFLEX): HIV 1&2 Ab, 4th Generation: NONREACTIVE

## 2019-01-14 LAB — HEMOGLOBINOPATHY EVALUATION
Fetal Hemoglobin Testing: <1 % (ref 0.0–1.9)
HCT: 36.7 % (ref 35.0–45.0)
Hemoglobin A2 - HGBRFX: 2.1 % (ref 1.8–3.5)
Hemoglobin: 12 g/dL (ref 11.7–15.5)
Hgb A: 96.9 % (ref >96.0)
MCH: 30.8 pg (ref 27.0–33.0)
MCV: 94.1 fL (ref 80.0–100.0)
RBC: 3.9 10*6/uL (ref 3.80–5.10)
RDW: 12.9 % (ref 11.0–15.0)

## 2019-01-14 LAB — SICKLE CELL SCREEN: Sickle Solubility Test - HGBRFX: NEGATIVE

## 2019-01-14 LAB — OBSTETRIC PANEL
Absolute Monocytes: 392 cells/uL (ref 200–950)
Antibody Screen: NOT DETECTED
Basophils Absolute: 21 cells/uL (ref 0–200)
Basophils Relative: 0.3 %
Eosinophils Absolute: 42 cells/uL (ref 15–500)
Eosinophils Relative: 0.6 %
HCT: 36.2 % (ref 35.0–45.0)
Hemoglobin: 11.9 g/dL (ref 11.7–15.5)
Hepatitis B Surface Ag: NONREACTIVE
Lymphs Abs: 2352 cells/uL (ref 850–3900)
MCH: 30.6 pg (ref 27.0–33.0)
MCHC: 32.9 g/dL (ref 32.0–36.0)
MCV: 93.1 fL (ref 80.0–100.0)
MPV: 10.2 fL (ref 7.5–12.5)
Monocytes Relative: 5.6 %
Neutro Abs: 4193 cells/uL (ref 1500–7800)
Neutrophils Relative %: 59.9 %
Platelets: 255 10*3/uL (ref 140–400)
RBC: 3.89 10*6/uL (ref 3.80–5.10)
RDW: 13.1 % (ref 11.0–15.0)
RPR Ser Ql: NONREACTIVE
Rubella: 2.59 index
Total Lymphocyte: 33.6 %
WBC: 7 10*3/uL (ref 3.8–10.8)

## 2019-01-14 LAB — CULTURE, OB URINE

## 2019-01-14 LAB — URINE CULTURE, OB REFLEX

## 2019-01-17 ENCOUNTER — Telehealth: Payer: Self-pay | Admitting: *Deleted

## 2019-01-17 LAB — URINE CYTOLOGY ANCILLARY ONLY
Chlamydia: NEGATIVE
Comment: NEGATIVE
Comment: NORMAL
Neisseria Gonorrhea: NEGATIVE

## 2019-01-17 NOTE — Telephone Encounter (Signed)
Left patient a message to call and schedule MFM U/S.

## 2019-01-18 ENCOUNTER — Other Ambulatory Visit: Payer: Self-pay | Admitting: *Deleted

## 2019-01-18 MED ORDER — PROMETHAZINE HCL 25 MG PO TABS: 25.0000 mg | ORAL_TABLET | Freq: Four times a day (QID) | ORAL | 1 refills | Status: DC | PRN

## 2019-01-26 ENCOUNTER — Other Ambulatory Visit: Payer: Self-pay

## 2019-01-26 ENCOUNTER — Ambulatory Visit (INDEPENDENT_AMBULATORY_CARE_PROVIDER_SITE_OTHER): Payer: Medicaid Other

## 2019-01-26 DIAGNOSIS — Z3480 Encounter for supervision of other normal pregnancy, unspecified trimester: Secondary | ICD-10-CM

## 2019-01-26 NOTE — Progress Notes (Signed)
PT here for Panorama. Blood drawn.

## 2019-02-01 ENCOUNTER — Encounter: Payer: Self-pay | Admitting: *Deleted

## 2019-02-03 ENCOUNTER — Encounter: Payer: Self-pay | Admitting: *Deleted

## 2019-02-07 ENCOUNTER — Encounter: Payer: Self-pay | Admitting: *Deleted

## 2019-02-07 DIAGNOSIS — Z3480 Encounter for supervision of other normal pregnancy, unspecified trimester: Secondary | ICD-10-CM

## 2019-02-09 ENCOUNTER — Telehealth (INDEPENDENT_AMBULATORY_CARE_PROVIDER_SITE_OTHER): Payer: Medicaid Other | Admitting: Obstetrics and Gynecology

## 2019-02-09 DIAGNOSIS — Z3A14 14 weeks gestation of pregnancy: Secondary | ICD-10-CM

## 2019-02-09 DIAGNOSIS — Z3480 Encounter for supervision of other normal pregnancy, unspecified trimester: Secondary | ICD-10-CM

## 2019-02-09 DIAGNOSIS — Z3482 Encounter for supervision of other normal pregnancy, second trimester: Secondary | ICD-10-CM | POA: Diagnosis not present

## 2019-02-09 MED ORDER — ONDANSETRON 8 MG PO TBDP: 8.0000 mg | ORAL_TABLET | Freq: Three times a day (TID) | ORAL | 2 refills | Status: DC | PRN

## 2019-02-09 MED ORDER — PRENATAL DHA 200 MG PO CAPS: 1.0000 | ORAL_CAPSULE | Freq: Every day | ORAL | 3 refills | Status: DC

## 2019-02-09 NOTE — Progress Notes (Signed)
   TELEHEALTH VIRTUAL OBSTETRICS VISIT ENCOUNTER NOTE  I connected with Evelyn Sutton on 02/09/19 at  8:30 AM EST by telephone at home and verified that I am speaking with the correct person using two identifiers.   I discussed the limitations, risks, security and privacy concerns of performing an evaluation and management service by telephone and the availability of in person appointments. I also discussed with the patient that there may be a patient responsible charge related to this service. The patient expressed understanding and agreed to proceed.  Subjective:  Evelyn Sutton is a 28 y.o. G3P2002 at [redacted]w[redacted]d being followed for ongoing prenatal care.  She is currently monitored for the following issues for this low-risk pregnancy and has Obesity in pregnancy; SVD (spontaneous vaginal delivery); Normal labor; Supervision of other normal pregnancy, antepartum; and Nausea and vomiting in pregnancy on their problem list.  Patient reports no complaints. Reports fetal movement. Denies any contractions, bleeding or leaking of fluid.   The following portions of the patient's history were reviewed and updated as appropriate: allergies, current medications, past family history, past medical history, past social history, past surgical history and problem list.   Objective:   General:  Alert, oriented and cooperative.   Mental Status: Normal mood and affect perceived. Normal judgment and thought content.  Rest of physical exam deferred due to type of encounter  Assessment and Plan:  Pregnancy: G3P2002 at [redacted]w[redacted]d 1. Supervision of other normal pregnancy, antepartum    BP 120/72 today.  Occasional N/V, Definitely improving.  Rx: Zofran   Preterm labor symptoms and general obstetric precautions including but not limited to vaginal bleeding, contractions, leaking of fluid and fetal movement were reviewed in detail with the patient.  I discussed the assessment and treatment plan with the patient. The  patient was provided an opportunity to ask questions and all were answered. The patient agreed with the plan and demonstrated an understanding of the instructions. The patient was advised to call back or seek an in-person office evaluation/go to [facility] at [facility] for any urgent or concerning symptoms. Please refer to After Visit Summary for other counseling recommendations.   I provided 10 minutes of non-face-to-face time during this encounter.  Return in about 4 weeks (around 03/09/2019), or In person visit for AFP.  Future Appointments  Date Time Provider [place]  03/15/2019 10:30 AM [facility]-MFC [place] 5 WH-MFCUS MFC-US    Jennifer Rasch, NP Center for [employer], [place]

## 2019-02-14 ENCOUNTER — Telehealth: Payer: Self-pay

## 2019-02-14 DIAGNOSIS — Z3480 Encounter for supervision of other normal pregnancy, unspecified trimester: Secondary | ICD-10-CM

## 2019-02-14 MED ORDER — CONCEPT DHA 53.5-38-1 MG PO CAPS: 1.0000 | ORAL_CAPSULE | Freq: Every day | ORAL | 12 refills | Status: DC

## 2019-02-14 NOTE — Telephone Encounter (Signed)
Pt requesting refill of Prenatal vitamin that she had last year. Rx sent.

## 2019-03-09 ENCOUNTER — Other Ambulatory Visit: Payer: Self-pay

## 2019-03-09 ENCOUNTER — Ambulatory Visit (INDEPENDENT_AMBULATORY_CARE_PROVIDER_SITE_OTHER): Payer: Medicaid Other | Admitting: Obstetrics and Gynecology

## 2019-03-09 VITALS — BP 122/77 | HR 96 | Wt 224.0 lb

## 2019-03-09 DIAGNOSIS — Z3A18 18 weeks gestation of pregnancy: Secondary | ICD-10-CM

## 2019-03-09 DIAGNOSIS — Z3482 Encounter for supervision of other normal pregnancy, second trimester: Secondary | ICD-10-CM

## 2019-03-09 DIAGNOSIS — Z3480 Encounter for supervision of other normal pregnancy, unspecified trimester: Secondary | ICD-10-CM

## 2019-03-09 NOTE — Progress Notes (Signed)
   PRENATAL VISIT NOTE  Subjective:  Evelyn Sutton is a 28 y.o. G3P2002 at 103w1d being seen today for ongoing prenatal care.  She is currently monitored for the following issues for this low risk pregnancy and has Obesity in pregnancy; SVD (spontaneous vaginal delivery); Normal labor; Supervision of other normal pregnancy, antepartum; and Nausea and vomiting in pregnancy on their problem list.  Patient reports no complaints.  Contractions: Not present. Vag. Bleeding: None.  Movement: Present. Denies leaking of fluid.   The following portions of the patient's history were reviewed and updated as appropriate: allergies, current medications, past family history, past medical history, past social history, past surgical history and problem list.   Objective:   Vitals:   03/09/19 0902  BP: 122/77  Pulse: 96  Weight: 224 lb (101.6 kg)    Fetal Status: Fetal Heart Rate (bpm): 159   Movement: Present     General:  Alert, oriented and cooperative. Patient is in no acute distress.  Skin: Skin is warm and dry. No rash noted.   Cardiovascular: Normal heart rate noted  Respiratory: Normal respiratory effort, no problems with respiration noted  Abdomen: Soft, gravid, appropriate for gestational age.  Pain/Pressure: Absent     Pelvic: Cervical exam deferred        Extremities: Normal range of motion.  Edema: None  Mental Status: Normal mood and affect. Normal behavior. Normal judgment and thought content.   Assessment and Plan:  Pregnancy: G3P2002 at [redacted]w[redacted]d  1. Supervision of other normal pregnancy, antepartum  - AFP only  Preterm labor symptoms and general obstetric precautions including but not limited to vaginal bleeding, contractions, leaking of fluid and fetal movement were reviewed in detail with the patient. Please refer to After Visit Summary for other counseling recommendations.   No follow-ups on file.  Future Appointments  Date Time Provider [place]  03/15/2019 10:30  AM [place] 5 WH-MFCUS MFC-US  04/06/2019  8:30 AM Shaheer Bonfield, Artist Pais, NP [facility]    Evelyn Saupe, NP

## 2019-03-10 LAB — ALPHA FETOPROTEIN, MATERNAL
AFP MoM: 0.68
AFP, Serum: 26.9 ng/mL
Calc'd Gestational Age: 18.1 weeks
Maternal Wt: 212 [lb_av]
Risk for ONTD: <1
Twins-AFP: 1

## 2019-03-15 ENCOUNTER — Other Ambulatory Visit: Payer: Self-pay

## 2019-03-15 ENCOUNTER — Ambulatory Visit (HOSPITAL_COMMUNITY)
Admission: RE | Admit: 2019-03-15 | Discharge: 2019-03-15 | Disposition: A | Discharge status: Home / Self Care | Payer: Medicaid Other | Source: Ambulatory Visit | Attending: Obstetrics and Gynecology | Admitting: Obstetrics and Gynecology

## 2019-03-15 ENCOUNTER — Other Ambulatory Visit: Payer: Self-pay | Admitting: Obstetrics and Gynecology

## 2019-03-15 ENCOUNTER — Other Ambulatory Visit: Payer: Self-pay | Admitting: Advanced Practice Midwife

## 2019-03-15 ENCOUNTER — Other Ambulatory Visit (HOSPITAL_COMMUNITY): Payer: Self-pay | Admitting: *Deleted

## 2019-03-15 DIAGNOSIS — O99212 Obesity complicating pregnancy, second trimester: Secondary | ICD-10-CM

## 2019-03-15 DIAGNOSIS — Z3A19 19 weeks gestation of pregnancy: Secondary | ICD-10-CM | POA: Diagnosis not present

## 2019-03-15 DIAGNOSIS — Z3480 Encounter for supervision of other normal pregnancy, unspecified trimester: Secondary | ICD-10-CM | POA: Insufficient documentation

## 2019-03-15 DIAGNOSIS — O283 Abnormal ultrasonic finding on antenatal screening of mother: Secondary | ICD-10-CM

## 2019-03-15 NOTE — Progress Notes (Signed)
Pt called with questions about today's anatomy US.  S/O was not present and would like to ask questions about findings.  Called MFM and Dr Gertie Exon to call pt today to go over results with pt and s/o.

## 2019-04-01 NOTE — L&D Delivery Note (Signed)
Delivery Note Arrived to find pt complete w/urge to push.  AROM clear fluid. After one push, at 6:55 AM a viable female was delivered via Vaginal, Spontaneous (Presentation: Left Occiput Anterior).  APGAR: 9, 9; weight pending.  After 1 minute, the cord was clamped and cut. 40 units of pitocin diluted in 1000cc LR was infused rapidly IV.  The placenta separated spontaneously and delivered via CCT and maternal pushing effort.  It was inspected and appears to be intact with a 3 VC.  She had some extra oozing so cytotec given PR  Anesthesia: None Episiotomy: None Lacerations: None Suture Repair:  Est. Blood Loss (mL):  675  Mom to postpartum.  Baby to Couplet care / Skin to Skin.  Evelyn Sutton 07/29/2019, 7:19 AM

## 2019-04-06 ENCOUNTER — Telehealth (INDEPENDENT_AMBULATORY_CARE_PROVIDER_SITE_OTHER): Payer: Medicaid Other | Admitting: Obstetrics and Gynecology

## 2019-04-06 ENCOUNTER — Telehealth: Payer: Self-pay | Admitting: *Deleted

## 2019-04-06 DIAGNOSIS — Z3A22 22 weeks gestation of pregnancy: Secondary | ICD-10-CM

## 2019-04-06 DIAGNOSIS — Z3482 Encounter for supervision of other normal pregnancy, second trimester: Secondary | ICD-10-CM

## 2019-04-06 DIAGNOSIS — Z3480 Encounter for supervision of other normal pregnancy, unspecified trimester: Secondary | ICD-10-CM

## 2019-04-06 NOTE — Patient Instructions (Addendum)
https://www.cdc.gov/vaccines/hcp/vis/vis-statements/tdap.pdf">  Tdap (Tetanus, Diphtheria, Pertussis) Vaccine: What You Need to Know 1. Why get vaccinated? Tdap vaccine can prevent tetanus, diphtheria, and pertussis. Diphtheria and pertussis spread from person to person. Tetanus enters the body through cuts or wounds.  TETANUS (T) causes painful stiffening of the muscles. Tetanus can lead to serious health problems, including being unable to open the mouth, having trouble swallowing and breathing, or death.  DIPHTHERIA (D) can lead to difficulty breathing, heart failure, paralysis, or death.  PERTUSSIS (aP), also known as "whooping cough," can cause uncontrollable, violent coughing which makes it hard to breathe, eat, or drink. Pertussis can be extremely serious in babies and young children, causing pneumonia, convulsions, brain damage, or death. In teens and adults, it can cause weight loss, loss of bladder control, passing out, and rib fractures from severe coughing. 2. Tdap vaccine Tdap is only for children 7 years and older, adolescents, and adults.  Adolescents should receive a single dose of Tdap, preferably at age 53 or 35 years. Pregnant women should get a dose of Tdap during every pregnancy, to protect the newborn from pertussis. Infants are most at risk for severe, life-threatening complications from pertussis. Adults who have never received Tdap should get a dose of Tdap. Also, adults should receive a booster dose every 10 years, or earlier in the case of a severe and dirty wound or burn. Booster doses can be either Tdap or Td (a different vaccine that protects against tetanus and diphtheria but not pertussis). Tdap may be given at the same time as other vaccines. 3. Talk with your health care provider Tell your vaccine provider if the person getting the vaccine:  Has had an allergic reaction after a previous dose of any vaccine that protects against tetanus, diphtheria, or pertussis,  or has any severe, life-threatening allergies.  Has had a coma, decreased level of consciousness, or prolonged seizures within 7 days after a previous dose of any pertussis vaccine (DTP, DTaP, or Tdap).  Has seizures or another nervous system problem.  Has ever had Guillain-Barr Syndrome (also called GBS).  Has had severe pain or swelling after a previous dose of any vaccine that protects against tetanus or diphtheria. In some cases, your health care provider may decide to postpone Tdap vaccination to a future visit.  People with minor illnesses, such as a cold, may be vaccinated. People who are moderately or severely ill should usually wait until they recover before getting Tdap vaccine.  Your health care provider can give you more information. 4. Risks of a vaccine reaction  Pain, redness, or swelling where the shot was given, mild fever, headache, feeling tired, and nausea, vomiting, diarrhea, or stomachache sometimes happen after Tdap vaccine. People sometimes faint after medical procedures, including vaccination. Tell your provider if you feel dizzy or have vision changes or ringing in the ears.  As with any medicine, there is a very remote chance of a vaccine causing a severe allergic reaction, other serious injury, or death. 5. What if there is a serious problem? An allergic reaction could occur after the vaccinated person leaves the clinic. If you see signs of a severe allergic reaction (hives, swelling of the face and throat, difficulty breathing, a fast heartbeat, dizziness, or weakness), call 9-1-1 and get the person to the nearest hospital. For other signs that concern you, call your health care provider.  Adverse reactions should be reported to the Vaccine Adverse Event Reporting System (VAERS). Your health care provider will usually file this report,  or you can do it yourself. Visit the VAERS website at www.vaers.SamedayNews.es or call (276) 781-0472. VAERS is only for reporting  reactions, and VAERS staff do not give medical advice. 6. The National Vaccine Injury Compensation Program The [employer] Vaccine Injury Compensation Program (VICP) is a federal program that was created to compensate people who may have been injured by certain vaccines. Visit the VICP website at GoldCloset.com.ee or call (484) 319-1235 to learn about the program and about filing a claim. There is a time limit to file a claim for compensation. 7. How can I learn more?  Ask your health care provider.  Call your local or state health department.  Contact the Centers for Disease Control and Prevention (CDC): ? Call 680-669-7163 (1-800-CDC-INFO) or ? Visit CDC's website at http://hunter.com/ Vaccine Information Statement Tdap (Tetanus, Diphtheria, Pertussis) Vaccine (06/30/2018) This information is not intended to replace advice given to you by your health care provider. Make sure you discuss any questions you have with your health care provider. Document Revised: 07/09/2018 Document Reviewed: 07/12/2018 Elsevier Patient Education  [place].     Blood Glucose Monitoring, Adult Monitoring your blood sugar (glucose) is an important part of managing your diabetes (diabetes mellitus). Blood glucose monitoring involves checking your blood glucose as often as directed and keeping a record (log) of your results over time. Checking your blood glucose regularly and keeping a blood glucose log can:  Help you and your health care provider adjust your diabetes management plan as needed, including your medicines or insulin.  Help you understand how food, exercise, illnesses, and medicines affect your blood glucose.  Let you know what your blood glucose is at any time. You can quickly find out if you have low blood glucose (hypoglycemia) or high blood glucose (hyperglycemia). Your health care provider will set individualized treatment goals for you. Your goals will be based on  your age, other medical conditions you have, and how you respond to diabetes treatment. Generally, the goal of treatment is to maintain the following blood glucose levels:  Before meals (preprandial): 80-130 mg/dL (4.4-7.2 mmol/L).  After meals (postprandial): below 180 mg/dL (10 mmol/L).  A1c level: less than 7%. Supplies needed:  Blood glucose meter.  Test strips for your meter. Each meter has its own strips. You must use the strips that came with your meter.  A needle to prick your finger (lancet). Do not use a lancet more than one time.  A device that holds the lancet (lancing device).  A journal or log book to write down your results. How to check your blood glucose  1. Wash your hands with soap and water. 2. Prick the side of your finger (not the tip) with the lancet. Use a different finger each time. 3. Gently rub the finger until a small drop of blood appears. 4. Follow instructions that come with your meter for inserting the test strip, applying blood to the strip, and using your blood glucose meter. 5. Write down your result and any notes. Some meters allow you to use areas of your body other than your finger (alternative sites) to test your blood. The most common alternative sites are:  Forearm.  Thigh.  Palm of the hand. If you think you may have hypoglycemia, or if you have a history of not knowing when your blood glucose is getting low (hypoglycemia unawareness), do not use alternative sites. Use your finger instead. Alternative sites may not be as accurate as the fingers, because blood flow is slower in  these areas. This means that the result you get may be delayed, and it may be different from the result that you would get from your finger. Follow these instructions at home: Blood glucose log   Every time you check your blood glucose, write down your result. Also write down any notes about things that may be affecting your blood glucose, such as your diet and  exercise for the day. This information can help you and your health care provider: ? Look for patterns in your blood glucose over time. ? Adjust your diabetes management plan as needed.  Check if your meter allows you to download your records to a computer. Most glucose meters store a record of glucose readings in the meter. If you have type 1 diabetes:  Check your blood glucose 2 or more times a day.  Also check your blood glucose: ? Before every insulin injection. ? Before and after exercise. ? Before meals. ? 2 hours after a meal. ? Occasionally between 2:00 a.m. and 3:00 a.m., as directed. ? Before potentially dangerous tasks, like driving or using heavy machinery. ? At bedtime.  You may need to check your blood glucose more often, up to 6-10 times a day, if you: ? Use an insulin pump. ? Need multiple daily injections (MDI). ? Have diabetes that is not well-controlled. ? Are ill. ? Have a history of severe hypoglycemia. ? Have hypoglycemia unawareness. If you have type 2 diabetes:  If you take insulin or other diabetes medicines, check your blood glucose 2 or more times a day.  If you are on intensive insulin therapy, check your blood glucose 4 or more times a day. Occasionally, you may also need to check between 2:00 a.m. and 3:00 a.m., as directed.  Also check your blood glucose: ? Before and after exercise. ? Before potentially dangerous tasks, like driving or using heavy machinery.  You may need to check your blood glucose more often if: ? Your medicine is being adjusted. ? Your diabetes is not well-controlled. ? You are ill. General tips  Always keep your supplies with you.  If you have questions or need help, all blood glucose meters have a 24-hour "hotline" phone number that you can call. You may also contact your health care provider.  After you use a few boxes of test strips, adjust (calibrate) your blood glucose meter by following instructions that came with  your meter. Contact a health care provider if:  Your blood glucose is at or above 240 mg/dL (16.113.3 mmol/L) for 2 days in a row.  You have been sick or have had a fever for 2 days or longer, and you are not getting better.  You have any of the following problems for more than 6 hours: ? You cannot eat or drink. ? You have nausea or vomiting. ? You have diarrhea. Get help right away if:  Your blood glucose is lower than 54 mg/dL (3 mmol/L).  You become confused or you have trouble thinking clearly.  You have difficulty breathing.  You have moderate or large ketone levels in your urine. Summary  Monitoring your blood sugar (glucose) is an important part of managing your diabetes (diabetes mellitus).  Blood glucose monitoring involves checking your blood glucose as often as directed and keeping a record (log) of your results over time.  Your health care provider will set individualized treatment goals for you. Your goals will be based on your age, other medical conditions you have, and how you respond  to diabetes treatment.  Every time you check your blood glucose, write down your result. Also write down any notes about things that may be affecting your blood glucose, such as your diet and exercise for the day. This information is not intended to replace advice given to you by your health care provider. Make sure you discuss any questions you have with your health care provider. Document Revised: 01/08/2018 Document Reviewed: 08/27/2015 Elsevier Patient Education  2020 [employer].  Gestational Diabetes Mellitus, Diagnosis Gestational diabetes (gestational diabetes mellitus) is a temporary form of diabetes that some women develop during pregnancy. It usually occurs around weeks 24-28 of pregnancy, and it goes away after delivery. Hormonal changes during pregnancy can interfere with insulin production and function, which may result in one or both of these problems:  The pancreas does  not make enough of a hormone called insulin.  Cells in the body do not respond properly to insulin that the body makes (insulin resistance). Normally, insulin allows blood sugar (glucose) to enter cells in the body. The cells use glucose for energy. Insulin resistance or lack of insulin causes excess glucose to build up in the blood instead of going into cells. As a result, high blood glucose (hyperglycemia) develops. If gestational diabetes is treated, it is not likely to cause problems. If it is not controlled with treatment, it may cause problems during labor and delivery, and some of those problems can be harmful to the unborn baby (fetus) and the mother. Women who get gestational diabetes are more likely to develop it if they get pregnant again, and they are more likely to develop type 2 diabetes in the future. What increases the risk? This condition may be more likely to develop in pregnant women who:  Are older than age 57 during pregnancy.  Have a family history of diabetes.  Are overweight.  Had gestational diabetes in the past.  Have polycystic ovary syndrome (PCOS).  Are pregnant with twins or multiples.  Are of American-Indian, African-American, Hispanic/Latino, or Asian/Pacific Islander descent. What are the signs or symptoms? Most women do not notice symptoms of gestational diabetes because the symptoms are similar to normal symptoms of pregnancy. Symptoms of gestational diabetes may include:  Increased thirst (polydipsia).  Increased hunger(polyphagia).  Increased urination (polyuria). How is this diagnosed? This condition may be diagnosed based on your blood glucose level, which may be checked with one or more of the following blood tests:  A fasting blood glucose (FBG) test. You will not be allowed to eat (you will fast) for 8 hours or longer before a blood sample is taken.  A random blood glucose test. This checks your blood glucose at any time of day regardless  of when you ate.  An oral glucose tolerance test (OGTT). This is usually done during weeks 24-28 of pregnancy. ? For this test, you will have an FBG test done. Then, you will drink a beverage that contains glucose. Your blood glucose will be tested again one hour after you drink the glucose beverage (1-hour OGTT). ? If the 1-hour OGTT result is at or above 140 mg/dL (7.8 mmol/L), you will repeat the OGTT. This time, your blood glucose will be tested 3 hours after you drink the glucose beverage (3-hour OGTT). If you have risk factors, you may be screened for undiagnosed type 2 diabetes at your first health care visit during your pregnancy (prenatal visit). How is this treated?     Your treatment may be managed by a specialist  called an endocrinologist. This condition is treated by following instructions from your health care provider about:  Eating a healthy diet and getting more physical activity. These changes are the most important ways to manage gestational diabetes.  Checking your blood glucose. Do this as often as told.  Taking diabetes medicines or insulin every day. These will only be prescribed if they are needed. ? If you use insulin, you may need to adjust your dosage based on how physically active you are and what foods you eat. Your health care provider will tell you how to do this. Your health care provider will set treatment goals for you based on the stage of your pregnancy and any other medical conditions you have. Generally, the goal of treatment is to maintain the following blood glucose levels during pregnancy:  Before meals (preprandial): at or below 95 mg/dL (5.3 mmol/L).  After meals (postprandial): ? One hour after a meal: at or below 140 mg/dL (7.8 mmol/L). ? Two hours after a meal: at or below 120 mg/dL (6.7 mmol/L).  A1c (hemoglobin A1c) level: 6-6.5%. Follow these instructions at home: Questions to ask your health care provider  Consider asking the following  questions: ? Do I need to meet with a diabetes educator? ? What equipment will I need to manage my diabetes at home? ? What diabetes medicines do I need, and when should I take them? ? How often do I need to check my blood glucose? ? What number can I call if I have questions? ? When is my next appointment? General instructions  Take over-the-counter and prescription medicines only as told by your health care provider.  Manage your weight gain during pregnancy. The amount of weight that you are expected to gain depends on your pre-pregnancy BMI (body mass index).  Keep all follow-up visits as told by your health care provider. This is important.  For more information about diabetes, visit: ? American Diabetes Association (ADA): www.diabetes.org ? American Association of Diabetes Educators (AADE): www.diabeteseducator.org Contact a health care provider if:  Your blood glucose level is at or above 240 mg/dL (87.8 mmol/L).  Your blood glucose level is at or above 200 mg/dL (67.6 mmol/L) and you have ketones in your urine.  You have been sick or have had a fever for 2 days or longer and you are not getting better.  You have any of the following problems for more than 6 hours: ? You cannot eat or drink. ? You have nausea and vomiting. ? You have diarrhea. Get help right away if:  Your blood glucose is lower than 54 mg/dL (3 mmol/L).  You become confused or you have trouble thinking clearly.  You have difficulty breathing.  You have moderate or large ketone levels in your urine.  Your baby is moving around less than usual.  You develop unusual discharge or bleeding from your vagina.  You start having contractions early (prematurely). Contractions may feel like a tightening in your lower abdomen. Summary  Gestational diabetes (gestational diabetes mellitus) is a temporary form of diabetes that some women develop during pregnancy. It usually occurs around weeks 24-28 of  pregnancy, and it goes away after delivery.  This condition is treated by making diet and lifestyle changes and taking diabetes medicines or insulin, if needed.  Women who get gestational diabetes are more likely to develop it if they get pregnant again, and they are more likely to develop type 2 diabetes in the future. This information is not intended to  replace advice given to you by your health care provider. Make sure you discuss any questions you have with your health care provider. Document Revised: 04/23/2017 Document Reviewed: 04/20/2015 Elsevier Patient Education  2020 [employer].

## 2019-04-06 NOTE — Progress Notes (Signed)
[place] VIRTUAL VIDEO VISIT ENCOUNTER NOTE  Provider location: [facility] [place] [facility] [place]   I connected with Levora Dredge on 04/06/19 at  8:30 AM EST by WebEx Video Encounter at home and verified that I am speaking with the correct person using two identifiers.   I discussed the limitations, risks, security and privacy concerns of performing an evaluation and management service virtually and the availability of in person appointments. I also discussed with the patient that there may be a patient responsible charge related to this service. The patient expressed understanding and agreed to proceed. Subjective:  Laxmi Choung is a 29 y.o. G3P2002 at [redacted]w[redacted]d being seen today for ongoing prenatal care.  She is currently monitored for the following issues for this low-risk pregnancy and has Obesity in pregnancy; SVD (spontaneous vaginal delivery); Normal labor; Supervision of other normal pregnancy, antepartum; and Nausea and vomiting in pregnancy on their problem list.  Patient reports no complaints.   .  .   . Denies any leaking of fluid.   The following portions of the patient's history were reviewed and updated as appropriate: allergies, current medications, past family history, past medical history, past social history, past surgical history and problem list.   Objective:  There were no vitals filed for this visit.  Fetal Status:           General:  Alert, oriented and cooperative. Patient is in no acute distress.  Respiratory: Normal respiratory effort, no problems with respiration noted  Mental Status: Normal mood and affect. Normal behavior. Normal judgment and thought content.  Rest of physical exam deferred due to type of encounter  Imaging: [place] MFM OB DETAIL +14 WK  Result Date: 03/19/2019 ----------------------------------------------------------------------  OBSTETRICS REPORT                    (Corrected Final 03/19/2019 10:52 pm)  ---------------------------------------------------------------------- Patient Info  ID #:       [id number]                          D.O.B.:  04-20-1990 (29 yrs)  Name:       [facility]                  Visit Date: 03/15/2019 11:44 am ---------------------------------------------------------------------- Performed By  Performed By:     Rodrigo Ran BS      Ref. Address:      [address] [place] [address]                                                              [place], [place]  Attending:        Sander Nephew      [facility]  [facility]  Referred By:      [facility] [place] ---------------------------------------------------------------------- Orders   #  Description                          Code         Ordered By   1  US MFM OB DETAIL +14 WK              76811.01     [facility]  ----------------------------------------------------------------------   #  Order #                    Accession #                 Episode #   1  [id number]                  [id number]  ---------------------------------------------------------------------- Indications   [redacted] weeks gestation of pregnancy                Z3A.19   Antenatal screening for malformations          Z36.3   Obesity complicating pregnancy, second         O99.212   trimester (pregravid BMI 35)  ---------------------------------------------------------------------- Fetal Evaluation  Num Of Fetuses:          1  Fetal Heart Rate(bpm):   154  Cardiac Activity:        Observed  Presentation:            Cephalic  Placenta:                Anterior  P. Cord Insertion:       Visualized  Amniotic Fluid  AFI FV:      Within normal limits                              Largest Pocket(cm)                              5.66 ---------------------------------------------------------------------- Biometry  BPD:      43.4  mm     G. Age:  19w 1d          56  %    CI:        72.59   %    70 - 86                                                          FL/HC:       17.2  %    16.1 - 18.3  HC:       162   mm     G. Age:  19w 0d         42  %    HC/AC:       1.19       1.09 - 1.39  AC:      135.8  mm     G. Age:  19w 0d         47  %  FL/BPD:      64.3  %  FL:       27.9  mm     G. Age:  18w 4d         27  %    FL/AC:       20.5  %    20 - 24  HUM:      27.8  mm     G. Age:  18w 6d         48  %  CER:      19.2  mm     G. Age:  18w 4d         38  %  NFT:       6.3  mm  CM:        5.1  mm  Est. FW:     260   gm     0 lb 9 oz     36  % ---------------------------------------------------------------------- OB History  Gravidity:    3         Term:   2        Prem:   0        SAB:   0  TOP:          0       Ectopic:  0        Living: 2 ---------------------------------------------------------------------- Gestational Age  LMP:           19w 0d        Date:  11/02/18                 EDD:   08/09/19  U/S Today:     19w 0d                                        EDD:   08/09/19  Best:          19w 0d     Det. By:  LMP  (11/02/18)          EDD:   08/09/19 ---------------------------------------------------------------------- Anatomy  Cranium:               Appears normal         Aortic Arch:            Appears normal  Cavum:                 Appears normal         Ductal Arch:            Appears normal  Ventricles:            Appears normal         Diaphragm:              Appears normal  Choroid Plexus:        Appears normal         Stomach:                Appears normal, left  sided  Cerebellum:            Appears normal         Abdomen:                Appears normal  Posterior Fossa:       Appears normal         Abdominal Wall:         Appears nml (cord                                                                        insert, abd wall)  Nuchal Fold:           Appears normal         Cord Vessels:            Appears normal (3                                                                        vessel cord)  Face:                  Appears normal         Kidneys:                Appear normal                         (orbits and profile)  Lips:                  Appears normal         Bladder:                Appears normal  Thoracic:              Appears normal         Spine:                  Appears normal  Heart:                 Appears normal         Upper Extremities:      Appears normal                         (4CH, axis, and                         situs)  RVOT:                  Appears normal         Lower Extremities:      Appears normal  LVOT:                  Appears normal  Other:  Heels/feet and open hands/5th digits visualized. Nasal bone          visualized. ---------------------------------------------------------------------- Cervix Uterus Adnexa  Cervix  Length:              4  cm.  Normal appearance by transabdominal scan.  Uterus  No abnormality visualized.  Left Ovary  Within normal limits.  Right Ovary  Within normal limits.  Cul De Sac  No free fluid seen.  Adnexa  No abnormality visualized. ---------------------------------------------------------------------- Impression  Normal anatomy. No ultrasonic evidence of structural fetal  anomalies.  BMI >35  Normal amniotic fluid and fetal movement  We observed a nuchal fold in 5.9 mm, however, overall  anatomy is normal.  Low risk NIPS ---------------------------------------------------------------------- Recommendations  Follow up growth in 4-6 weeks. ----------------------------------------------------------------------                    Lin Landsman, MD Electronically Signed Corrected Final Report  03/19/2019 10:52 pm ----------------------------------------------------------------------   Assessment and Plan:  Pregnancy: G3P2002 at [redacted]w[redacted]d  1. Supervision of other normal pregnancy, antepartum  She will take her BP and weight today and  enter it into baby scripts.  In person visit in 4 weeks for Tdap, flu and 2 hour GTT Continue BASA Increase nuchal fold on [place]. Patient had long discussion with Dr. Grace Bushy and feels reassured. Repeat US ordered, NIPS testing is normal.    There are no diagnoses linked to this encounter. Preterm labor symptoms and general obstetric precautions including but not limited to vaginal bleeding, contractions, leaking of fluid and fetal movement were reviewed in detail with the patient. I discussed the assessment and treatment plan with the patient. The patient was provided an opportunity to ask questions and all were answered. The patient agreed with the plan and demonstrated an understanding of the instructions. The patient was advised to call back or seek an in-person office evaluation/go to [facility] at [facility] for any urgent or concerning symptoms. Please refer to After Visit Summary for other counseling recommendations.   I provided 11 minutes of face-to-face time during this encounter.  Return in about 4 weeks (around 05/04/2019) for In-office for 2 hour GTT. Marland Kitchen  Future Appointments  Date Time Provider Department Center  04/19/2019  8:40 AM WH-MFC NURSE WH-MFC MFC-US  04/19/2019  8:45 AM WH-MFC [place] 2 WH-MFCUS MFC-US    Venia Carbon, NP [facility] [employer], [facility]

## 2019-04-06 NOTE — Telephone Encounter (Signed)
Left patient a message to call and schedule next appointment. °

## 2019-04-07 ENCOUNTER — Telehealth: Payer: Self-pay | Admitting: *Deleted

## 2019-04-07 NOTE — Telephone Encounter (Signed)
Not able to leave patient a message to schedule appointment. 2nd phone attempt and has not read [facility] message.

## 2019-04-19 ENCOUNTER — Encounter (HOSPITAL_COMMUNITY): Payer: Self-pay

## 2019-04-19 ENCOUNTER — Ambulatory Visit (HOSPITAL_COMMUNITY)
Admission: RE | Admit: 2019-04-19 | Discharge: 2019-04-19 | Disposition: A | Discharge status: Home / Self Care | Payer: Medicaid Other | Source: Ambulatory Visit | Attending: Maternal & Fetal Medicine | Admitting: Maternal & Fetal Medicine

## 2019-04-19 ENCOUNTER — Ambulatory Visit (HOSPITAL_COMMUNITY): Payer: Medicaid Other | Admitting: *Deleted

## 2019-04-19 ENCOUNTER — Other Ambulatory Visit: Payer: Self-pay

## 2019-04-19 VITALS — BP 125/75 | HR 96 | Temp 97.9°F

## 2019-04-19 DIAGNOSIS — Z3A24 24 weeks gestation of pregnancy: Secondary | ICD-10-CM | POA: Diagnosis not present

## 2019-04-19 DIAGNOSIS — O99212 Obesity complicating pregnancy, second trimester: Secondary | ICD-10-CM | POA: Diagnosis present

## 2019-04-19 DIAGNOSIS — Z3480 Encounter for supervision of other normal pregnancy, unspecified trimester: Secondary | ICD-10-CM

## 2019-04-19 DIAGNOSIS — Z362 Encounter for other antenatal screening follow-up: Secondary | ICD-10-CM | POA: Diagnosis not present

## 2019-05-06 ENCOUNTER — Ambulatory Visit (INDEPENDENT_AMBULATORY_CARE_PROVIDER_SITE_OTHER): Payer: Medicaid Other | Admitting: Obstetrics and Gynecology

## 2019-05-06 ENCOUNTER — Other Ambulatory Visit: Payer: Self-pay

## 2019-05-06 DIAGNOSIS — Z3480 Encounter for supervision of other normal pregnancy, unspecified trimester: Secondary | ICD-10-CM

## 2019-05-06 DIAGNOSIS — Z3A26 26 weeks gestation of pregnancy: Secondary | ICD-10-CM

## 2019-05-06 DIAGNOSIS — Z3482 Encounter for supervision of other normal pregnancy, second trimester: Secondary | ICD-10-CM

## 2019-05-06 NOTE — Progress Notes (Signed)
   PRENATAL VISIT NOTE  Subjective:  Evelyn Sutton is a 29 y.o. G3P2002 at [redacted]w[redacted]d being seen today for ongoing prenatal care.  She is currently monitored for the following issues for this low-risk pregnancy and has Obesity in pregnancy; SVD (spontaneous vaginal delivery); Normal labor; Supervision of other normal pregnancy, antepartum; and Nausea and vomiting in pregnancy on their problem list.  Patient reports no complaints.  Contractions: Not present. Vag. Bleeding: None.  Movement: Present. Denies leaking of fluid.   The following portions of the patient's history were reviewed and updated as appropriate: allergies, current medications, past family history, past medical history, past social history, past surgical history and problem list.   Objective:   Vitals:   05/06/19 0853  BP: 112/72  Pulse: 100  Weight: 233 lb (105.7 kg)    Fetal Status: Fetal Heart Rate (bpm): 158 Fundal Height: 26 cm Movement: Present     General:  Alert, oriented and cooperative. Patient is in no acute distress.  Skin: Skin is warm and dry. No rash noted.   Cardiovascular: Normal heart rate noted  Respiratory: Normal respiratory effort, no problems with respiration noted  Abdomen: Soft, gravid, appropriate for gestational age.  Pain/Pressure: Present     Pelvic: Cervical exam deferred        Extremities: Normal range of motion.  Edema: None  Mental Status: Normal mood and affect. Normal behavior. Normal judgment and thought content.   Assessment and Plan:  Pregnancy: G3P2002 at [redacted]w[redacted]d 1. Supervision of other normal pregnancy, antepartum  - 2Hr GTT w/ 1 Hr Carpenter 75 g - CBC - HIV antibody (with reflex) - RPR - f/u in 3 weeks, virtual ok.   Preterm labor symptoms and general obstetric precautions including but not limited to vaginal bleeding, contractions, leaking of fluid and fetal movement were reviewed in detail with the patient. Please refer to After Visit Summary for other counseling  recommendations.   No follow-ups on file.  No future appointments.  Venia Carbon, NP

## 2019-05-09 LAB — CBC
HCT: 30.3 % — ABNORMAL LOW (ref 35.0–45.0)
Hemoglobin: 9.9 g/dL — ABNORMAL LOW (ref 11.7–15.5)
MCH: 28.7 pg (ref 27.0–33.0)
MCHC: 32.7 g/dL (ref 32.0–36.0)
MCV: 87.8 fL (ref 80.0–100.0)
MPV: 10.9 fL (ref 7.5–12.5)
Platelets: 241 10*3/uL (ref 140–400)
RBC: 3.45 10*6/uL — ABNORMAL LOW (ref 3.80–5.10)
RDW: 13.6 % (ref 11.0–15.0)
WBC: 7 10*3/uL (ref 3.8–10.8)

## 2019-05-09 LAB — 2HR GTT W 1 HR, CARPENTER, 75 G
Glucose, 1 Hr, Gest: 160 mg/dL (ref 65–179)
Glucose, 2 Hr, Gest: 137 mg/dL (ref 65–152)
Glucose, Fasting, Gest: 92 mg/dL — ABNORMAL HIGH (ref 65–91)

## 2019-05-09 LAB — RPR: RPR Ser Ql: NONREACTIVE

## 2019-05-09 LAB — HIV ANTIBODY (ROUTINE TESTING W REFLEX): HIV 1&2 Ab, 4th Generation: NONREACTIVE

## 2019-05-10 ENCOUNTER — Encounter: Payer: Self-pay | Admitting: *Deleted

## 2019-05-10 ENCOUNTER — Telehealth: Payer: Self-pay | Admitting: *Deleted

## 2019-05-10 DIAGNOSIS — O24419 Gestational diabetes mellitus in pregnancy, unspecified control: Secondary | ICD-10-CM

## 2019-05-10 NOTE — Telephone Encounter (Signed)
Pt failed 2 hr GTT by 1 point.  Nutrition and Diabetes referral placed and pt sent my chart message.

## 2019-05-11 ENCOUNTER — Encounter: Payer: Self-pay | Admitting: Obstetrics and Gynecology

## 2019-05-11 ENCOUNTER — Other Ambulatory Visit: Payer: Self-pay | Admitting: Obstetrics and Gynecology

## 2019-05-11 DIAGNOSIS — D649 Anemia, unspecified: Secondary | ICD-10-CM | POA: Insufficient documentation

## 2019-05-11 MED ORDER — ACCU-CHEK GUIDE VI STRP: ORAL_STRIP | 12 refills | Status: DC

## 2019-05-11 MED ORDER — ACCU-CHEK GUIDE W/DEVICE KIT: 1.0000 | PACK | Freq: Once | 0 refills | Status: AC

## 2019-05-11 MED ORDER — ACCU-CHEK FASTCLIX LANCETS MISC: 1.0000 | Freq: Four times a day (QID) | 12 refills | Status: DC

## 2019-05-11 NOTE — Progress Notes (Signed)
DM supplies sent it.    Duane Lope, NP 05/11/2019 9:06 PM

## 2019-05-17 ENCOUNTER — Other Ambulatory Visit: Payer: Self-pay | Admitting: *Deleted

## 2019-05-17 DIAGNOSIS — O24419 Gestational diabetes mellitus in pregnancy, unspecified control: Secondary | ICD-10-CM

## 2019-05-19 ENCOUNTER — Other Ambulatory Visit: Payer: Medicaid Other

## 2019-05-26 ENCOUNTER — Other Ambulatory Visit: Payer: Medicaid Other

## 2019-05-27 ENCOUNTER — Telehealth (INDEPENDENT_AMBULATORY_CARE_PROVIDER_SITE_OTHER): Payer: Medicaid Other | Admitting: Certified Nurse Midwife

## 2019-05-27 ENCOUNTER — Other Ambulatory Visit: Payer: Self-pay

## 2019-05-27 ENCOUNTER — Telehealth: Payer: Self-pay | Admitting: *Deleted

## 2019-05-27 DIAGNOSIS — Z3A29 29 weeks gestation of pregnancy: Secondary | ICD-10-CM | POA: Diagnosis not present

## 2019-05-27 DIAGNOSIS — Z3480 Encounter for supervision of other normal pregnancy, unspecified trimester: Secondary | ICD-10-CM

## 2019-05-27 DIAGNOSIS — O24419 Gestational diabetes mellitus in pregnancy, unspecified control: Secondary | ICD-10-CM

## 2019-05-27 NOTE — Progress Notes (Signed)
Pt not doing wts or BP's.  She is non compliant and will need in person visits.  She cancelled her N&D appt and needs to resched.

## 2019-05-27 NOTE — Progress Notes (Signed)
   TELEHEALTH OBSTETRICS PRENATAL VISIT ENCOUNTER NOTE  Provider location: [facility] [place]   I connected with Evelyn Sutton on 05/27/19 at  8:35 AM EST by Phone at home and verified that I am speaking with the correct person using two identifiers.   I discussed the limitations, risks, security and privacy concerns of performing an evaluation and management service virtually and the availability of in person appointments. I also discussed with the patient that there may be a patient responsible charge related to this service. The patient expressed understanding and agreed to proceed. Subjective:  Evelyn Sutton is a 29 y.o. G3P2002 at [redacted]w[redacted]d being seen today for ongoing prenatal care.  She is currently monitored for the following issues for this high-risk pregnancy and has Obesity in pregnancy; SVD (spontaneous vaginal delivery); Normal labor; Supervision of other normal pregnancy, antepartum; Nausea and vomiting in pregnancy; Gestational diabetes; and Anemia on their problem list.  Patient reports no complaints.  Contractions: Not present. Vag. Bleeding: None.  Movement: Present. Denies any leaking of fluid.   The following portions of the patient's history were reviewed and updated as appropriate: allergies, current medications, past family history, past medical history, past social history, past surgical history and problem list.   Objective:  BP 124/73 Weight 233  Fetal Status:     Movement: Present     General:  Alert, oriented and cooperative. Patient is in no acute distress.  Respiratory: Normal respiratory effort, no problems with respiration noted  Mental Status: Normal mood and affect. Normal behavior. Normal judgment and thought content.  Rest of physical exam deferred due to type of encounter  Imaging: No results found.  Assessment and Plan:  Pregnancy: G3P2002 at [redacted]w[redacted]d 1. Gestational diabetes mellitus (GDM), antepartum, gestational diabetes  method of control unspecified - missed diabetes education yesterday, needs to reschedule - [place] for growth  2. Supervision of other normal pregnancy, antepartum   Preterm labor symptoms and general obstetric precautions including but not limited to vaginal bleeding, contractions, leaking of fluid and fetal movement were reviewed in detail with the patient. I discussed the assessment and treatment plan with the patient. The patient was provided an opportunity to ask questions and all were answered. The patient agreed with the plan and demonstrated an understanding of the instructions. The patient was advised to call back or seek an in-person office evaluation/go to MAU at [facility] for any urgent or concerning symptoms. Please refer to After Visit Summary for other counseling recommendations.   I provided 8 minutes of face-to-face time during this encounter.  Return in about 2 weeks (around 06/10/2019).  No future appointments.  Donette Larry, CNM [facility] [employer], [facility]

## 2019-05-27 NOTE — Telephone Encounter (Signed)
Left patient a message to call and schedule appointments by 11:40 AM today or Monday at 8:00 AM.

## 2019-06-02 ENCOUNTER — Telehealth: Payer: Self-pay | Admitting: *Deleted

## 2019-06-02 NOTE — Telephone Encounter (Signed)
Left patient an urgent message to call or [facility] message to schedule appointments.

## 2019-06-06 ENCOUNTER — Other Ambulatory Visit (HOSPITAL_COMMUNITY): Payer: Self-pay | Admitting: *Deleted

## 2019-06-06 ENCOUNTER — Other Ambulatory Visit: Payer: Self-pay

## 2019-06-06 ENCOUNTER — Ambulatory Visit (HOSPITAL_COMMUNITY): Payer: Medicaid Other | Admitting: *Deleted

## 2019-06-06 ENCOUNTER — Ambulatory Visit (HOSPITAL_COMMUNITY)
Admission: RE | Admit: 2019-06-06 | Discharge: 2019-06-06 | Disposition: A | Discharge status: Home / Self Care | Payer: Medicaid Other | Source: Ambulatory Visit | Attending: Certified Nurse Midwife | Admitting: Certified Nurse Midwife

## 2019-06-06 ENCOUNTER — Encounter (HOSPITAL_COMMUNITY): Payer: Self-pay

## 2019-06-06 DIAGNOSIS — O99213 Obesity complicating pregnancy, third trimester: Secondary | ICD-10-CM

## 2019-06-06 DIAGNOSIS — Z3480 Encounter for supervision of other normal pregnancy, unspecified trimester: Secondary | ICD-10-CM | POA: Diagnosis present

## 2019-06-06 DIAGNOSIS — Z3A30 30 weeks gestation of pregnancy: Secondary | ICD-10-CM

## 2019-06-06 DIAGNOSIS — Z362 Encounter for other antenatal screening follow-up: Secondary | ICD-10-CM

## 2019-06-06 DIAGNOSIS — O24419 Gestational diabetes mellitus in pregnancy, unspecified control: Secondary | ICD-10-CM

## 2019-06-06 DIAGNOSIS — O24410 Gestational diabetes mellitus in pregnancy, diet controlled: Secondary | ICD-10-CM

## 2019-06-07 ENCOUNTER — Ambulatory Visit: Payer: Medicaid Other | Admitting: Registered"

## 2019-06-07 ENCOUNTER — Encounter: Payer: Medicaid Other | Attending: Obstetrics and Gynecology | Admitting: Registered"

## 2019-06-07 DIAGNOSIS — Z713 Dietary counseling and surveillance: Secondary | ICD-10-CM | POA: Insufficient documentation

## 2019-06-07 DIAGNOSIS — O24419 Gestational diabetes mellitus in pregnancy, unspecified control: Secondary | ICD-10-CM | POA: Diagnosis not present

## 2019-06-07 DIAGNOSIS — Z3A00 Weeks of gestation of pregnancy not specified: Secondary | ICD-10-CM | POA: Insufficient documentation

## 2019-06-07 NOTE — Progress Notes (Signed)
Patient was seen on 06/07/19 for Gestational Diabetes self-management. EDD 08/09/19. Patient states no history of GDM. Diet history obtained. Patient eats variety of all food groups. Beverages include water, occasional soda.    Patient reports having low energy. Pt lab 05/06/19 shows low hemoglobin and HCT. RD encouraged patient to eat more iron rich foods and also discuss with MD.  The following learning objectives were met by the patient :   States the definition of Gestational Diabetes  States why dietary management is important in controlling blood glucose  Describes the effects of carbohydrates on blood glucose levels  Demonstrates ability to create a balanced meal plan  Demonstrates carbohydrate counting   States when to check blood glucose levels  Demonstrates proper blood glucose monitoring techniques  States the effect of stress and exercise on blood glucose levels  States the importance of limiting caffeine and abstaining from alcohol and smoking  Plan:  Aim for 3 Carbohydrate Choices per meal (45 grams) +/- 1 either way  Aim for 1-2 Carbohydrate Choices per snack Begin reading food labels for Total Carbohydrate of foods If OK with your MD, consider  increasing your activity level by walking, Arm Chair Exercises or other activity daily as tolerated Begin checking Blood Glucose before breakfast and 2 hours after first bite of breakfast, lunch and dinner as directed by MD  Baby Scripts: RD added Glucose management to Patient's Babyscripts account Take medication if directed by MD  Patient already has a meter and is testing pre breakfast and 2 hours after each meal. Review of Log Book shows: FBS: 83-110 mg/dL mostly in 100s; Post prandial 81-120 mg/dL 3 readings above target after eating breakfast that includes sausage, egg, biscuit 121, 132, & 143 mg/dL  Patient instructed to monitor glucose levels: FBS: 60 - 95 mg/dl 2 hour: <120 mg/dl  Patient received the following  handouts:  Nutrition Diabetes and Pregnancy  Carbohydrate Counting List  Patient will be seen for follow-up as needed.

## 2019-06-08 ENCOUNTER — Ambulatory Visit (INDEPENDENT_AMBULATORY_CARE_PROVIDER_SITE_OTHER): Payer: Medicaid Other | Admitting: Obstetrics and Gynecology

## 2019-06-08 ENCOUNTER — Other Ambulatory Visit: Payer: Self-pay

## 2019-06-08 VITALS — BP 106/72 | HR 104 | Wt 236.0 lb

## 2019-06-08 DIAGNOSIS — Z3A31 31 weeks gestation of pregnancy: Secondary | ICD-10-CM

## 2019-06-08 DIAGNOSIS — O24410 Gestational diabetes mellitus in pregnancy, diet controlled: Secondary | ICD-10-CM

## 2019-06-08 DIAGNOSIS — Z3480 Encounter for supervision of other normal pregnancy, unspecified trimester: Secondary | ICD-10-CM

## 2019-06-08 NOTE — Patient Instructions (Addendum)

## 2019-06-08 NOTE — Progress Notes (Signed)
   PRENATAL VISIT NOTE  Subjective:  Evelyn Sutton is a 29 y.o. G3P2002 at 41w1dbeing seen today for ongoing prenatal care.  She is currently monitored for the following issues for this high-risk pregnancy and has Obesity in pregnancy; SVD (spontaneous vaginal delivery); Normal labor; Supervision of other normal pregnancy, antepartum; Nausea and vomiting in pregnancy; Gestational diabetes; and Anemia on their problem list.  Patient reports continued pelvic, round ligament pain .  Contractions: Not present. Vag. Bleeding: None.  Movement: Present. Denies leaking of fluid.   The following portions of the patient's history were reviewed and updated as appropriate: allergies, current medications, past family history, past medical history, past social history, past surgical history and problem list.   Objective:   Vitals:   06/08/19 0817  BP: 106/72  Pulse: (!) 104  Weight: 236 lb (107 kg)    Fetal Status: Fetal Heart Rate (bpm): 149   Movement: Present     General:  Alert, oriented and cooperative. Patient is in no acute distress.  Skin: Skin is warm and dry. No rash noted.   Cardiovascular: Normal heart rate noted  Respiratory: Normal respiratory effort, no problems with respiration noted  Abdomen: Soft, gravid, appropriate for gestational age.  Pain/Pressure: Present     Pelvic: Cervical exam deferred        Extremities: Normal range of motion.  Edema: None  Mental Status: Normal mood and affect. Normal behavior. Normal judgment and thought content.   Assessment and Plan:  Pregnancy: G3P2002 at 375w1d1. Supervision of other normal pregnancy, antepartum  Interested in water birth Showed her how to access water birth class on [facility] baby web-site She will meet with CNM next visit and sign consent.  Reviewed importance of TDAP vaccine in pregnancy- she is unsure. Information printed, will offer at next visit.  Increased stress d/t trying to buy a house. Encourage positive  self talk, walking and counseling if stress increases.   2. Diet controlled gestational diabetes mellitus (GDM) in third trimester  Doing well with her diet. Eating mostly veggies, meat and berries.  She met with dietician yesterday. Did not bring her log today. Dietician showed her how to log into baby scripts and she plans on doing that.  States some fasting BS over 100. Dietician recommended a late night protein snack. She will try this and monitor fasting BS. If continues discussed adding metformin.  Growth US[place]/9: Est. FW:    1956  gm      4 lb 5 oz     86  %  There are no diagnoses linked to this encounter. Preterm labor symptoms and general obstetric precautions including but not limited to vaginal bleeding, contractions, leaking of fluid and fetal movement were reviewed in detail with the patient. Please refer to After Visit Summary for other counseling recommendations.   Return for Scheduled with midwife to review water bir .  Future Appointments  Date Time Provider DeTyro3/26/2021  9:25 AM BhJulianne HandlerCNM [facility]VA [facility]4/07/2019  8:00 AM WHSan LorenzoURSE WH[place]FC-US  07/04/2019  8:00 AM WHPerkinsSKorea WH-MFCUS MFC-US    JeNoni SaupeNP

## 2019-06-10 ENCOUNTER — Other Ambulatory Visit: Payer: Self-pay

## 2019-06-24 ENCOUNTER — Other Ambulatory Visit: Payer: Self-pay

## 2019-06-24 ENCOUNTER — Ambulatory Visit (INDEPENDENT_AMBULATORY_CARE_PROVIDER_SITE_OTHER): Payer: Medicaid Other | Admitting: Certified Nurse Midwife

## 2019-06-24 VITALS — BP 118/72 | HR 102 | Temp 98.3°F | Wt 235.0 lb

## 2019-06-24 DIAGNOSIS — O24410 Gestational diabetes mellitus in pregnancy, diet controlled: Secondary | ICD-10-CM

## 2019-06-24 DIAGNOSIS — Z3480 Encounter for supervision of other normal pregnancy, unspecified trimester: Secondary | ICD-10-CM

## 2019-06-24 NOTE — Progress Notes (Signed)
Subjective:  Evelyn Sutton is a 29 y.o. G3P2002 at [redacted]w[redacted]d being seen today for ongoing prenatal care.  She is currently monitored for the following issues for this low-risk pregnancy and has Obesity in pregnancy; SVD (spontaneous vaginal delivery); Normal labor; Supervision of other normal pregnancy, antepartum; Nausea and vomiting in pregnancy; Gestational diabetes; and Anemia on their problem list.  Patient reports no complaints.  Contractions: Irritability. Vag. Bleeding: None.  Movement: Present. Denies leaking of fluid.   The following portions of the patient's history were reviewed and updated as appropriate: allergies, current medications, past family history, past medical history, past social history, past surgical history and problem list. Problem list updated.  Objective:   Vitals:   06/24/19 0942  BP: 118/72  Pulse: (!) 102  Temp: 98.3 F (36.8 C)  Weight: 235 lb (106.6 kg)    Fetal Status: Fetal Heart Rate (bpm): 154 Fundal Height: 33 cm Movement: Present  Presentation: Undeterminable  General:  Alert, oriented and cooperative. Patient is in no acute distress.  Skin: Skin is warm and dry. No rash noted.   Cardiovascular: Normal heart rate noted  Respiratory: Normal respiratory effort, no problems with respiration noted  Abdomen: Soft, gravid, appropriate for gestational age. Pain/Pressure: Absent     Pelvic: Vag. Bleeding: None Vag D/C Character: Thin   Cervical exam deferred        Extremities: Normal range of motion.  Edema: None  Mental Status: Normal mood and affect. Normal behavior. Normal judgment and thought content.   Urinalysis:      Assessment and Plan:  Pregnancy: G3P2002 at [redacted]w[redacted]d  1. Supervision of other normal pregnancy, antepartum - desires waterbirth - took class in first pregnancy and had waterbirth - will discuss further toward end of pregnancy if she does not risk out  2. Diet controlled gestational diabetes mellitus (GDM) in third trimester -  did not bring log  - reports FBS 98-110, and pp 115s - discussed likely need for meds - she will try changing bedtime snack to improve fastings over the next week - she will send BS log through babyscripts or mychart next week - if FBS not improved, recommend starting Metformin at hs - EFW 86%ile at 30 wks, f/u US scheduled  Preterm labor symptoms and general obstetric precautions including but not limited to vaginal bleeding, contractions, leaking of fluid and fetal movement were reviewed in detail with the patient. Please refer to After Visit Summary for other counseling recommendations.  Return in about 2 weeks (around 07/08/2019).- in person   Donette Larry, [place]

## 2019-07-01 ENCOUNTER — Encounter: Payer: Self-pay | Admitting: Family Medicine

## 2019-07-03 ENCOUNTER — Encounter: Payer: Self-pay | Admitting: Family Medicine

## 2019-07-04 ENCOUNTER — Other Ambulatory Visit (HOSPITAL_COMMUNITY): Payer: Self-pay | Admitting: Obstetrics and Gynecology

## 2019-07-04 ENCOUNTER — Other Ambulatory Visit: Payer: Self-pay

## 2019-07-04 ENCOUNTER — Ambulatory Visit (HOSPITAL_COMMUNITY): Payer: Medicaid Other | Admitting: *Deleted

## 2019-07-04 ENCOUNTER — Other Ambulatory Visit (HOSPITAL_COMMUNITY): Payer: Self-pay | Admitting: *Deleted

## 2019-07-04 ENCOUNTER — Encounter (HOSPITAL_COMMUNITY): Payer: Self-pay | Admitting: *Deleted

## 2019-07-04 ENCOUNTER — Ambulatory Visit (HOSPITAL_COMMUNITY)
Admission: RE | Admit: 2019-07-04 | Discharge: 2019-07-04 | Disposition: A | Discharge status: Home / Self Care | Payer: Medicaid Other | Source: Ambulatory Visit | Attending: Obstetrics and Gynecology | Admitting: Obstetrics and Gynecology

## 2019-07-04 DIAGNOSIS — Z3480 Encounter for supervision of other normal pregnancy, unspecified trimester: Secondary | ICD-10-CM | POA: Insufficient documentation

## 2019-07-04 DIAGNOSIS — O24419 Gestational diabetes mellitus in pregnancy, unspecified control: Secondary | ICD-10-CM

## 2019-07-04 DIAGNOSIS — O99213 Obesity complicating pregnancy, third trimester: Secondary | ICD-10-CM | POA: Diagnosis not present

## 2019-07-04 DIAGNOSIS — O24410 Gestational diabetes mellitus in pregnancy, diet controlled: Secondary | ICD-10-CM | POA: Insufficient documentation

## 2019-07-04 DIAGNOSIS — Z3A34 34 weeks gestation of pregnancy: Secondary | ICD-10-CM | POA: Diagnosis not present

## 2019-07-04 DIAGNOSIS — Z362 Encounter for other antenatal screening follow-up: Secondary | ICD-10-CM

## 2019-07-06 ENCOUNTER — Other Ambulatory Visit: Payer: Self-pay

## 2019-07-06 ENCOUNTER — Ambulatory Visit (INDEPENDENT_AMBULATORY_CARE_PROVIDER_SITE_OTHER): Payer: Medicaid Other | Admitting: Obstetrics and Gynecology

## 2019-07-06 VITALS — BP 123/72 | HR 97 | Temp 98.3°F | Wt 236.0 lb

## 2019-07-06 DIAGNOSIS — Z3480 Encounter for supervision of other normal pregnancy, unspecified trimester: Secondary | ICD-10-CM

## 2019-07-06 DIAGNOSIS — O24415 Gestational diabetes mellitus in pregnancy, controlled by oral hypoglycemic drugs: Secondary | ICD-10-CM

## 2019-07-06 MED ORDER — METFORMIN HCL 500 MG PO TABS: 500.0000 mg | ORAL_TABLET | Freq: Every day | ORAL | 1 refills | Status: DC

## 2019-07-06 NOTE — Progress Notes (Signed)
   PRENATAL VISIT NOTE  Subjective:  Evelyn Sutton is a 29 y.o. G3P2002 at [redacted]w[redacted]d being seen today for ongoing prenatal care.  She is currently monitored for the following issues for this high-risk pregnancy and has Obesity in pregnancy; Supervision of other normal pregnancy, antepartum; Nausea and vomiting in pregnancy; Gestational diabetes; and Anemia on their problem list.  Patient reports no complaints.  Contractions: Irritability. Vag. Bleeding: None.  Movement: Present. Denies leaking of fluid.   The following portions of the patient's history were reviewed and updated as appropriate: allergies, current medications, past family history, past medical history, past social history, past surgical history and problem list.   Objective:   Vitals:   07/06/19 0902  BP: 123/72  Pulse: 97  Temp: 98.3 F (36.8 C)  Weight: 236 lb (107 kg)    Fetal Status: Fetal Heart Rate (bpm): 153   Movement: Present     General:  Alert, oriented and cooperative. Patient is in no acute distress.  Skin: Skin is warm and dry. No rash noted.   Cardiovascular: Normal heart rate noted  Respiratory: Normal respiratory effort, no problems with respiration noted  Abdomen: Soft, gravid, appropriate for gestational age.  Pain/Pressure: Absent     Pelvic: Cervical exam deferred        Extremities: Normal range of motion.  Edema: None  Mental Status: Normal mood and affect. Normal behavior. Normal judgment and thought content.   Assessment and Plan:  Pregnancy: G3P2002 at [redacted]w[redacted]d  1. Supervision of other normal pregnancy, antepartum   2. Gestational diabetes mellitus (GDM) controlled on oral hypoglycemic drug, antepartum  6/10 fasting sugars 93-102. Majority 95 or less All PP readings less than 120 Has not been eating a protein snack at bedtime. Has been limiting carbohydrate intake in the evenings and exercising more.  Discussed patient with Dr. Vergie Living who recommends her next appointment to be changed to  MD schedule to discuss induction at 37 weeks -38 per MFM recommendations (37-39 weeks). He recommends she start Metformin 500 mg PO at bedtime. Evelyn Sutton was called and these recommendations were reviewed in detail. She is agreeable with this plan. Rx: Metformin to start today- or tomorrow.  Last growth US shows Est. FW:    3213  gm      7 lb 1 oz     98  %   Preterm labor symptoms and general obstetric precautions including but not limited to vaginal bleeding, contractions, leaking of fluid and fetal movement were reviewed in detail with the patient. Please refer to After Visit Summary for other counseling recommendations.   Return in about 1 week (around 07/13/2019) for in person visit. .  Future Appointments  Date Time Provider Department Center  07/11/2019  3:30 PM [facility] NURSE [facility]  07/11/2019  3:30 PM [facility]-MFC [place] 1 WH-MFCUS MFC-US  07/12/2019  8:15 AM Sharyon Cable, CNM [facility]  07/18/2019  8:45 AM WH-MFC NURSE WH-MFC [facility]-US  07/18/2019  8:45 AM WH-MFC [place] 2 WH-MFCUS M[facility]-US  07/25/2019  3:15 PM [facility]-MFC NURSE WH-MFC [facility]-US  07/25/2019  3:15 PM WH-MFC [place] 4 WH-MFCUS MFC-US    Venia Carbon, NP

## 2019-07-06 NOTE — Patient Instructions (Addendum)
Cervical Ripening: May try one or both  Red Raspberry Leaf capsules:  two 300mg  or 400mg  tablets with each meal, 2-3 times a day  Potential Side Effects Of Raspberry Leaf:  Most women do not experience any side effects from drinking raspberry leaf tea. However, nausea and loose stools are possible     Evening Primrose Oil capsules: may take 1 to 3 capsules daily. May also prick one to release the oil and insert it into your vagina at night.  Some of the potential side effects:  Upset stomach  Loose stools or diarrhea  Headaches  Nausea:    The [employer]uit  This circuit takes at least 90 minutes to complete so clear your schedule and make mental preparations so you can relax in your environment. The second step requires a lot of pillows so gather them up before beginning Before starting, you should empty your bladder! Have a nice drink nearby, and make sure it has a straw! If you are having contractions, this circuit should be done through contractions, try not to change positions between steps Before you begin...  "I named this 'circuit' after my friend , who shared and discussed it with me when I was working with a client whose labor seemed to be stalled out and no longer progressing... This circuit is useful to help get the baby lined up, ideally, in the "Left Occiput Anterior" (LOA) Position, both before labor begins and when some corrections need to be done during labor. Prenatally, this position set can help to rotate a baby. As a natural method of induction, this can help get things going if baby just needed a gentle nudge of position to set things off. To the best of my knowledge, this group of positions will not "hurt" a baby that is already lined up correctly." -   Step One: Open-knee Chest Stay in this position for 30 minutes, start in cat/cow, then drop your chest as low as you can to the bed or the floor and your bottom as high as you can.  Knees should be fairly wide apart, and the angle between the torso/thighs should be wider than 90 degrees. Wiggle around, prop with lots of pillows and use this time to get totally relaxed. This position allows the baby to scoot out of the pelvis a bit and gives them room to rotate, shift their head position, etc. If the pregnant person finds it helpful, careful positioning with a rebozo under the belly, with gentle tension from a support person behind can help maintain this position for the full 30 minutes.  Step Deneen Harts Left Side Lying Roll to your left side, bringing your top leg as high as possible and keeping your bottom leg straight. Roll forward as much as possible, again using a lot of pillows. Sink into the bed and relax some more. If you fall asleep, that's totally okay and you can stay there! If not, stay here for at least another half an hour. Try and get your top right leg up towards your head and get as rolled over onto your belly as much as possible. If you repeat the circuit during labor, try alternating left and right sides. We know the photo the left is actually right side... just flip the image in your head.  Step Three: Moving and Lunges Lunge, walk stairs facing sideways, 2 at a time, (have a spotter downstairs of you!), take a walk outside with one foot on the curb and the other  on the street, sit on a birth ball and hula- anything that's upright and putting your pelvis in open, asymmetrical positions. Spend at least 30 minutes doing this one as well to give your baby a chance to move down. If you are lunging or stair or curb walking, you should lunge/walk/go up stairs in the direction that feels better to you. The key with the lunge is that the toes of the higher leg and mom's belly button should be at right angles. Do not lunge over your knee, that closes the pelvis.     [place] Creator -  [email or web address] Greggory Stallion, CD, BDT (DONA), LCCE, FACCE: Supporting Content - [email or web address] Jon Gills: Photography - [email or web address] Trina Ao CD/CDT [facility]): Print and Webmaster - [email or web address]    OUTPATIENT FOLEY BULB INDUCTION OF LABOR:  Information Sheet for Mothers and Family               What's a Foley Bulb Induction? A Foley bulb induction is a procedure where your provider inserts a catheter into your cervix. Once inside your womb, your provider inflates the balloon with a saline solution.   This puts pressure on your cervix and encourages dilation. The catheter falls out once your cervix dilates to 3-4 centimeters.     With any procedure, it's important that you know what to expect. The insertion of a Foley catheter can be a bit uncomfortable, and some women experience sharp pelvic pain. The pain may subside once the catheter is in place. You may experience some cramping when the Foley catheter is in place.  This is normal.     GO TO THE MATERNITY ADMISSIONS UNIT FOR THE FOLLOWING:  Heavy vaginal bleeding  Rupture of membranes (fluid that wets your underwear)  Painful uterine contractions every 5 minutes or less  Severe abdominal discomfort  Decreased movement of the baby

## 2019-07-07 ENCOUNTER — Telehealth: Payer: Self-pay | Admitting: *Deleted

## 2019-07-07 NOTE — Telephone Encounter (Signed)
Left patient an URGENT message to call the office to reschedule appointment with a MD.

## 2019-07-11 ENCOUNTER — Ambulatory Visit (HOSPITAL_COMMUNITY)
Admission: RE | Admit: 2019-07-11 | Discharge: 2019-07-11 | Disposition: A | Discharge status: Home / Self Care | Payer: Medicaid Other | Source: Ambulatory Visit | Attending: Obstetrics and Gynecology | Admitting: Obstetrics and Gynecology

## 2019-07-11 ENCOUNTER — Ambulatory Visit (HOSPITAL_COMMUNITY): Payer: Medicaid Other | Admitting: *Deleted

## 2019-07-11 ENCOUNTER — Encounter (HOSPITAL_COMMUNITY): Payer: Self-pay

## 2019-07-11 ENCOUNTER — Other Ambulatory Visit (HOSPITAL_COMMUNITY)
Admission: RE | Admit: 2019-07-11 | Discharge: 2019-07-11 | Disposition: A | Discharge status: Home / Self Care | Payer: Medicaid Other | Source: Ambulatory Visit | Attending: Obstetrics & Gynecology | Admitting: Obstetrics & Gynecology

## 2019-07-11 ENCOUNTER — Other Ambulatory Visit: Payer: Self-pay

## 2019-07-11 ENCOUNTER — Ambulatory Visit (INDEPENDENT_AMBULATORY_CARE_PROVIDER_SITE_OTHER): Payer: Medicaid Other | Admitting: Obstetrics & Gynecology

## 2019-07-11 VITALS — BP 124/70 | HR 89 | Wt 236.0 lb

## 2019-07-11 DIAGNOSIS — O24415 Gestational diabetes mellitus in pregnancy, controlled by oral hypoglycemic drugs: Secondary | ICD-10-CM

## 2019-07-11 DIAGNOSIS — Z3480 Encounter for supervision of other normal pregnancy, unspecified trimester: Secondary | ICD-10-CM

## 2019-07-11 DIAGNOSIS — O99210 Obesity complicating pregnancy, unspecified trimester: Secondary | ICD-10-CM | POA: Insufficient documentation

## 2019-07-11 DIAGNOSIS — Z3A35 35 weeks gestation of pregnancy: Secondary | ICD-10-CM | POA: Diagnosis not present

## 2019-07-11 DIAGNOSIS — O24410 Gestational diabetes mellitus in pregnancy, diet controlled: Secondary | ICD-10-CM | POA: Insufficient documentation

## 2019-07-11 DIAGNOSIS — O99213 Obesity complicating pregnancy, third trimester: Secondary | ICD-10-CM

## 2019-07-11 DIAGNOSIS — Z362 Encounter for other antenatal screening follow-up: Secondary | ICD-10-CM

## 2019-07-11 NOTE — Progress Notes (Signed)
    PRENATAL VISIT NOTE  Subjective:  Evelyn Sutton is a 29 y.o. G3P2002 at [redacted]w[redacted]d being seen today for ongoing prenatal care.  She is currently monitored for the following issues for this high-risk pregnancy and has Obesity in pregnancy; Supervision of other normal pregnancy, antepartum; Nausea and vomiting in pregnancy; Gestational diabetes; and Anemia on their problem list.  Patient reports no complaints.  Contractions: Irritability. Vag. Bleeding: None.  Movement: Present. Denies leaking of fluid.   The following portions of the patient's history were reviewed and updated as appropriate: allergies, current medications, past family history, past medical history, past social history, past surgical history and problem list.   Objective:   Vitals:   07/11/19 1339  BP: 124/70  Pulse: 89  Weight: 236 lb (107 kg)    Fetal Status: Fetal Heart Rate (bpm): 141   Movement: Present     General:  Alert, oriented and cooperative. Patient is in no acute distress.  Skin: Skin is warm and dry. No rash noted.   Cardiovascular: Normal heart rate noted  Respiratory: Normal respiratory effort, no problems with respiration noted  Abdomen: Soft, gravid, appropriate for gestational age.  Pain/Pressure: Present     Pelvic: Cervical exam deferred        Extremities: Normal range of motion.  Edema: None  Mental Status: Normal mood and affect. Normal behavior. Normal judgment and thought content.   Assessment and Plan:  Pregnancy: G3P2002 at [redacted]w[redacted]d 1. Supervision of other normal pregnancy, antepartum - Culture, beta strep (group b only) - Cervicovaginal ancillary only( [facility])  2. Gestational diabetes mellitus (GDM) in third trimester controlled on oral hypoglycemic drug On metformin Fastings:  All below 95 except today 106.   2 hr pp:  All below 120 Continue antenatal testing and serial growth [place].  CBGs well controlled now so will plan on induction at 39 weeks.   EFW  98%; Pelvis proven to 9  1/2 pounds.  Term labor symptoms and general obstetric precautions including but not limited to vaginal bleeding, contractions, leaking of fluid and fetal movement were reviewed in detail with the patient. Please refer to After Visit Summary for other counseling recommendations.   No follow-ups on file.  Future Appointments  Date Time Provider Department Center  07/18/2019  8:45 AM [facility] NURSE WH-MFC MFC-US  07/18/2019  8:45 AM WH-MFC [place] 2 WH-MFCUS MFC-US  07/21/2019  4:15 PM Constant, Gigi Gin, MD [facility]  07/25/2019  3:15 PM WH-MFC NURSE WH-MFC MFC-US  07/25/2019  3:15 PM WH-MFC [place] 4 WH-MFCUS MFC-US    Elsie Lincoln, MD

## 2019-07-12 ENCOUNTER — Encounter: Payer: Medicaid Other | Admitting: Certified Nurse Midwife

## 2019-07-12 LAB — CERVICOVAGINAL ANCILLARY ONLY
Chlamydia: NEGATIVE
Comment: NEGATIVE
Comment: NORMAL
Neisseria Gonorrhea: NEGATIVE

## 2019-07-15 LAB — CULTURE, BETA STREP (GROUP B ONLY)
MICRO NUMBER:: 10352305
SPECIMEN QUALITY:: ADEQUATE

## 2019-07-18 ENCOUNTER — Ambulatory Visit (HOSPITAL_COMMUNITY)
Admission: RE | Admit: 2019-07-18 | Discharge: 2019-07-18 | Disposition: A | Discharge status: Home / Self Care | Payer: Medicaid Other | Source: Ambulatory Visit | Attending: Obstetrics and Gynecology | Admitting: Obstetrics and Gynecology

## 2019-07-18 ENCOUNTER — Encounter (HOSPITAL_COMMUNITY): Payer: Self-pay

## 2019-07-18 ENCOUNTER — Ambulatory Visit (HOSPITAL_COMMUNITY): Payer: Medicaid Other | Admitting: *Deleted

## 2019-07-18 ENCOUNTER — Other Ambulatory Visit: Payer: Self-pay

## 2019-07-18 DIAGNOSIS — O99210 Obesity complicating pregnancy, unspecified trimester: Secondary | ICD-10-CM | POA: Diagnosis present

## 2019-07-18 DIAGNOSIS — Z3480 Encounter for supervision of other normal pregnancy, unspecified trimester: Secondary | ICD-10-CM

## 2019-07-18 DIAGNOSIS — O24410 Gestational diabetes mellitus in pregnancy, diet controlled: Secondary | ICD-10-CM | POA: Diagnosis not present

## 2019-07-18 DIAGNOSIS — Z362 Encounter for other antenatal screening follow-up: Secondary | ICD-10-CM

## 2019-07-18 DIAGNOSIS — O99213 Obesity complicating pregnancy, third trimester: Secondary | ICD-10-CM | POA: Diagnosis not present

## 2019-07-18 DIAGNOSIS — E669 Obesity, unspecified: Secondary | ICD-10-CM

## 2019-07-18 DIAGNOSIS — Z3A36 36 weeks gestation of pregnancy: Secondary | ICD-10-CM

## 2019-07-21 ENCOUNTER — Encounter: Payer: Self-pay | Admitting: Obstetrics and Gynecology

## 2019-07-21 ENCOUNTER — Ambulatory Visit (INDEPENDENT_AMBULATORY_CARE_PROVIDER_SITE_OTHER): Payer: Medicaid Other | Admitting: Obstetrics and Gynecology

## 2019-07-21 ENCOUNTER — Other Ambulatory Visit: Payer: Self-pay

## 2019-07-21 VITALS — BP 124/76 | HR 104 | Temp 97.7°F | Wt 239.0 lb

## 2019-07-21 DIAGNOSIS — O24415 Gestational diabetes mellitus in pregnancy, controlled by oral hypoglycemic drugs: Secondary | ICD-10-CM

## 2019-07-21 DIAGNOSIS — O99210 Obesity complicating pregnancy, unspecified trimester: Secondary | ICD-10-CM

## 2019-07-21 DIAGNOSIS — Z3480 Encounter for supervision of other normal pregnancy, unspecified trimester: Secondary | ICD-10-CM

## 2019-07-21 NOTE — Progress Notes (Signed)
   PRENATAL VISIT NOTE  Subjective:  Evelyn Sutton is a 29 y.o. G3P2002 at [redacted]w[redacted]d being seen today for ongoing prenatal care.  She is currently monitored for the following issues for this high-risk pregnancy and has Obesity in pregnancy; Supervision of other normal pregnancy, antepartum; Nausea and vomiting in pregnancy; Gestational diabetes; and Anemia on their problem list.  Patient reports no complaints.  Contractions: Irritability. Vag. Bleeding: None.  Movement: Present. Denies leaking of fluid.   The following portions of the patient's history were reviewed and updated as appropriate: allergies, current medications, past family history, past medical history, past social history, past surgical history and problem list.   Objective:   Vitals:   07/21/19 1614  BP: 124/76  Pulse: (!) 104  Temp: 97.7 F (36.5 C)  Weight: 239 lb (108.4 kg)    Fetal Status: Fetal Heart Rate (bpm): 140 Fundal Height: 38 cm Movement: Present  Presentation: Vertex  General:  Alert, oriented and cooperative. Patient is in no acute distress.  Skin: Skin is warm and dry. No rash noted.   Cardiovascular: Normal heart rate noted  Respiratory: Normal respiratory effort, no problems with respiration noted  Abdomen: Soft, gravid, appropriate for gestational age.  Pain/Pressure: Absent     Pelvic: Cervical exam deferred Dilation: 1 Effacement (%): Thick Station: Ballotable  Extremities: Normal range of motion.  Edema: None  Mental Status: Normal mood and affect. Normal behavior. Normal judgment and thought content.   Assessment and Plan:  Pregnancy: G3P2002 at [redacted]w[redacted]d 1. Supervision of other normal pregnancy, antepartum Patient is doing well without complaints  2. Gestational diabetes mellitus (GDM) in third trimester controlled on oral hypoglycemic drug CBGs reviewed and all within range (2/8 elevated fasting; all postprandial within range) Continue metformin BPP 8/8 4/19 continue weekly antenatal testing  IOL at 39 weeks  3. Obesity in pregnancy Continue ASA  Term labor symptoms and general obstetric precautions including but not limited to vaginal bleeding, contractions, leaking of fluid and fetal movement were reviewed in detail with the patient. Please refer to After Visit Summary for other counseling recommendations.   No follow-ups on file.  Future Appointments  Date Time Provider Department Center  07/25/2019  3:15 PM WH-MFC NURSE WH-MFC MFC-US  07/25/2019  3:15 PM WH-MFC [place] 4 WH-MFCUS MFC-US    Catalina Antigua, MD

## 2019-07-22 ENCOUNTER — Telehealth (HOSPITAL_COMMUNITY): Payer: Self-pay | Admitting: *Deleted

## 2019-07-22 NOTE — Telephone Encounter (Signed)
Preadmission screen  

## 2019-07-25 ENCOUNTER — Encounter (HOSPITAL_COMMUNITY): Payer: Self-pay

## 2019-07-25 ENCOUNTER — Ambulatory Visit (HOSPITAL_COMMUNITY): Payer: Medicaid Other | Admitting: *Deleted

## 2019-07-25 ENCOUNTER — Other Ambulatory Visit: Payer: Self-pay

## 2019-07-25 ENCOUNTER — Ambulatory Visit (HOSPITAL_COMMUNITY)
Admission: RE | Admit: 2019-07-25 | Discharge: 2019-07-25 | Disposition: A | Discharge status: Home / Self Care | Payer: Medicaid Other | Source: Ambulatory Visit | Attending: Obstetrics and Gynecology | Admitting: Obstetrics and Gynecology

## 2019-07-25 DIAGNOSIS — O24410 Gestational diabetes mellitus in pregnancy, diet controlled: Secondary | ICD-10-CM | POA: Insufficient documentation

## 2019-07-25 DIAGNOSIS — O99213 Obesity complicating pregnancy, third trimester: Secondary | ICD-10-CM

## 2019-07-25 DIAGNOSIS — E669 Obesity, unspecified: Secondary | ICD-10-CM | POA: Diagnosis not present

## 2019-07-25 DIAGNOSIS — O24415 Gestational diabetes mellitus in pregnancy, controlled by oral hypoglycemic drugs: Secondary | ICD-10-CM

## 2019-07-25 DIAGNOSIS — Z362 Encounter for other antenatal screening follow-up: Secondary | ICD-10-CM | POA: Diagnosis not present

## 2019-07-25 DIAGNOSIS — O99210 Obesity complicating pregnancy, unspecified trimester: Secondary | ICD-10-CM | POA: Insufficient documentation

## 2019-07-25 DIAGNOSIS — Z3480 Encounter for supervision of other normal pregnancy, unspecified trimester: Secondary | ICD-10-CM | POA: Diagnosis present

## 2019-07-25 DIAGNOSIS — Z3A37 37 weeks gestation of pregnancy: Secondary | ICD-10-CM

## 2019-07-27 ENCOUNTER — Other Ambulatory Visit: Payer: Self-pay | Admitting: Advanced Practice Midwife

## 2019-07-28 ENCOUNTER — Ambulatory Visit (INDEPENDENT_AMBULATORY_CARE_PROVIDER_SITE_OTHER): Payer: Medicaid Other | Admitting: Obstetrics & Gynecology

## 2019-07-28 ENCOUNTER — Encounter (HOSPITAL_COMMUNITY): Payer: Self-pay | Admitting: *Deleted

## 2019-07-28 ENCOUNTER — Other Ambulatory Visit: Payer: Self-pay

## 2019-07-28 ENCOUNTER — Telehealth (HOSPITAL_COMMUNITY): Payer: Self-pay | Admitting: *Deleted

## 2019-07-28 VITALS — BP 126/74 | HR 114 | Temp 98.1°F | Wt 244.0 lb

## 2019-07-28 DIAGNOSIS — D508 Other iron deficiency anemias: Secondary | ICD-10-CM

## 2019-07-28 DIAGNOSIS — Z3480 Encounter for supervision of other normal pregnancy, unspecified trimester: Secondary | ICD-10-CM

## 2019-07-28 DIAGNOSIS — O24414 Gestational diabetes mellitus in pregnancy, insulin controlled: Secondary | ICD-10-CM

## 2019-07-28 DIAGNOSIS — O99210 Obesity complicating pregnancy, unspecified trimester: Secondary | ICD-10-CM

## 2019-07-28 NOTE — Telephone Encounter (Signed)
Preadmission screen  

## 2019-07-28 NOTE — Progress Notes (Signed)
   PRENATAL VISIT NOTE  Subjective:  Evelyn Sutton is a 29 y.o. G3P2002 at [redacted]w[redacted]d being seen today for ongoing prenatal care.  She is currently monitored for the following issues for this high-risk pregnancy and has Obesity in pregnancy; Supervision of other normal pregnancy, antepartum; Nausea and vomiting in pregnancy; Gestational diabetes; and Anemia on their problem list.  Patient reports no complaints.  Contractions: Irregular. Vag. Bleeding: None.  Movement: Present. Denies leaking of fluid.   The following portions of the patient's history were reviewed and updated as appropriate: allergies, current medications, past family history, past medical history, past social history, past surgical history and problem list.   Objective:   Vitals:   07/28/19 1609  BP: 126/74  Pulse: (!) 114  Temp: 98.1 F (36.7 C)  Weight: 244 lb (110.7 kg)    Fetal Status: Fetal Heart Rate (bpm): 146   Movement: Present     General:  Alert, oriented and cooperative. Patient is in no acute distress.  Skin: Skin is warm and dry. No rash noted.   Cardiovascular: Normal heart rate noted  Respiratory: Normal respiratory effort, no problems with respiration noted  Abdomen: Soft, gravid, appropriate for gestational age.  Pain/Pressure: Present     Pelvic: Cervical exam performed in the presence of a chaperone        Extremities: Normal range of motion.  Edema: None  Mental Status: Normal mood and affect. Normal behavior. Normal judgment and thought content.   Assessment and Plan:  Pregnancy: G3P2002 at [redacted]w[redacted]d 1. Supervision of other normal pregnancy, antepartum FH and FHR WNL Pt requested membrane stripping.  Reviewed IOL procedures. All questions answered.   2. Insulin controlled gestational diabetes mellitus (GDM) during pregnancy, antepartum Reviewed glucose log. All levels WNL  3. Other iron deficiency anemia  4. Obesity in pregnancy  Term labor symptoms and general obstetric precautions  including but not limited to vaginal bleeding, contractions, leaking of fluid and fetal movement were reviewed in detail with the patient. Please refer to After Visit Summary for other counseling recommendations.   Return in about 3 weeks (around 08/18/2019).  Future Appointments  Date Time Provider Department Center  08/01/2019  9:45 AM MC-SCREENING [facility]-SDSC None  08/02/2019 12:00 AM MC-LD SCHED ROOM MC-INDC None    Willodean Rosenthal, MD

## 2019-07-28 NOTE — Progress Notes (Signed)
Would like membranes swept

## 2019-07-29 ENCOUNTER — Telehealth: Payer: Self-pay | Admitting: *Deleted

## 2019-07-29 ENCOUNTER — Encounter (HOSPITAL_COMMUNITY): Payer: Self-pay | Admitting: Obstetrics and Gynecology

## 2019-07-29 ENCOUNTER — Inpatient Hospital Stay (HOSPITAL_COMMUNITY)
Admission: AD | Admit: 2019-07-29 | Discharge: 2019-07-31 | DRG: 807 | Disposition: A | Discharge status: Home / Self Care | Payer: Medicaid Other | Attending: Obstetrics and Gynecology | Admitting: Obstetrics and Gynecology

## 2019-07-29 DIAGNOSIS — O99824 Streptococcus B carrier state complicating childbirth: Secondary | ICD-10-CM | POA: Diagnosis present

## 2019-07-29 DIAGNOSIS — Z3A38 38 weeks gestation of pregnancy: Secondary | ICD-10-CM

## 2019-07-29 DIAGNOSIS — Z3490 Encounter for supervision of normal pregnancy, unspecified, unspecified trimester: Secondary | ICD-10-CM | POA: Diagnosis present

## 2019-07-29 DIAGNOSIS — O24420 Gestational diabetes mellitus in childbirth, diet controlled: Secondary | ICD-10-CM | POA: Diagnosis present

## 2019-07-29 DIAGNOSIS — O24425 Gestational diabetes mellitus in childbirth, controlled by oral hypoglycemic drugs: Secondary | ICD-10-CM

## 2019-07-29 DIAGNOSIS — O26893 Other specified pregnancy related conditions, third trimester: Secondary | ICD-10-CM | POA: Diagnosis present

## 2019-07-29 DIAGNOSIS — Z20822 Contact with and (suspected) exposure to covid-19: Secondary | ICD-10-CM | POA: Diagnosis present

## 2019-07-29 DIAGNOSIS — D508 Other iron deficiency anemias: Secondary | ICD-10-CM

## 2019-07-29 DIAGNOSIS — O24415 Gestational diabetes mellitus in pregnancy, controlled by oral hypoglycemic drugs: Secondary | ICD-10-CM

## 2019-07-29 DIAGNOSIS — D649 Anemia, unspecified: Secondary | ICD-10-CM | POA: Diagnosis present

## 2019-07-29 DIAGNOSIS — O9902 Anemia complicating childbirth: Secondary | ICD-10-CM | POA: Diagnosis present

## 2019-07-29 LAB — CBC
HCT: 32.3 % — ABNORMAL LOW (ref 36.0–46.0)
Hemoglobin: 9.7 g/dL — ABNORMAL LOW (ref 12.0–15.0)
MCH: 25.7 pg — ABNORMAL LOW (ref 26.0–34.0)
MCHC: 30 g/dL (ref 30.0–36.0)
MCV: 85.4 fL (ref 80.0–100.0)
Platelets: 304 10*3/uL (ref 150–400)
RBC: 3.78 MIL/uL — ABNORMAL LOW (ref 3.87–5.11)
RDW: 17.1 % — ABNORMAL HIGH (ref 11.5–15.5)
WBC: 10.3 10*3/uL (ref 4.0–10.5)
nRBC: 0 % (ref 0.0–0.2)

## 2019-07-29 LAB — RESPIRATORY PANEL BY RT PCR (FLU A&B, COVID)
Influenza A by PCR: NEGATIVE
Influenza B by PCR: NEGATIVE
SARS Coronavirus 2 by RT PCR: NEGATIVE

## 2019-07-29 LAB — RPR: RPR Ser Ql: NONREACTIVE

## 2019-07-29 LAB — TYPE AND SCREEN
ABO/RH(D): O POS
Antibody Screen: NEGATIVE

## 2019-07-29 LAB — ABO/RH: ABO/RH(D): O POS

## 2019-07-29 MED ORDER — ACETAMINOPHEN 325 MG PO TABS: 650.0000 mg | ORAL_TABLET | ORAL | Status: DC | PRN

## 2019-07-29 MED ORDER — SODIUM CHLORIDE 0.9 % IV SOLN: 5.0000 10*6.[IU] | Freq: Once | INTRAVENOUS | Status: DC

## 2019-07-29 MED ORDER — ONDANSETRON HCL 4 MG/2ML IJ SOLN: 4.0000 mg | INTRAMUSCULAR | Status: DC | PRN

## 2019-07-29 MED ORDER — PRENATAL MULTIVITAMIN CH
1.0000 | ORAL_TABLET | Freq: Every day | ORAL | Status: DC
  Administered 2019-07-29 – 2019-07-31 (×3): 1 via ORAL
  Filled 2019-07-29 (×3): qty 1

## 2019-07-29 MED ORDER — LACTATED RINGERS IV SOLN: 500.0000 mL | INTRAVENOUS | Status: DC | PRN

## 2019-07-29 MED ORDER — SOD CITRATE-CITRIC ACID 500-334 MG/5ML PO SOLN: 30.0000 mL | ORAL | Status: DC | PRN

## 2019-07-29 MED ORDER — ACETAMINOPHEN 325 MG PO TABS
650.0000 mg | ORAL_TABLET | ORAL | Status: DC | PRN
  Administered 2019-07-29 – 2019-07-30 (×2): 650 mg via ORAL
  Filled 2019-07-29 (×2): qty 2

## 2019-07-29 MED ORDER — LIDOCAINE HCL (PF) 1 % IJ SOLN: 30.0000 mL | INTRAMUSCULAR | Status: DC | PRN

## 2019-07-29 MED ORDER — LACTATED RINGERS IV SOLN: INTRAVENOUS | Status: DC

## 2019-07-29 MED ORDER — TETANUS-DIPHTH-ACELL PERTUSSIS 5-2.5-18.5 LF-MCG/0.5 IM SUSP: 0.5000 mL | Freq: Once | INTRAMUSCULAR | Status: DC

## 2019-07-29 MED ORDER — SIMETHICONE 80 MG PO CHEW: 80.0000 mg | CHEWABLE_TABLET | ORAL | Status: DC | PRN

## 2019-07-29 MED ORDER — IBUPROFEN 600 MG PO TABS
600.0000 mg | ORAL_TABLET | Freq: Four times a day (QID) | ORAL | Status: DC
  Administered 2019-07-29 – 2019-07-31 (×9): 600 mg via ORAL
  Filled 2019-07-29 (×9): qty 1

## 2019-07-29 MED ORDER — SENNOSIDES-DOCUSATE SODIUM 8.6-50 MG PO TABS
2.0000 | ORAL_TABLET | ORAL | Status: DC
  Administered 2019-07-29 – 2019-07-31 (×2): 2 via ORAL
  Filled 2019-07-29 (×2): qty 2

## 2019-07-29 MED ORDER — MISOPROSTOL 200 MCG PO TABS
800.0000 ug | ORAL_TABLET | Freq: Once | ORAL | Status: AC
  Administered 2019-07-29: 07:00:00 800 ug via RECTAL

## 2019-07-29 MED ORDER — DIPHENHYDRAMINE HCL 25 MG PO CAPS: 25.0000 mg | ORAL_CAPSULE | Freq: Four times a day (QID) | ORAL | Status: DC | PRN

## 2019-07-29 MED ORDER — ONDANSETRON HCL 4 MG/2ML IJ SOLN: 4.0000 mg | Freq: Four times a day (QID) | INTRAMUSCULAR | Status: DC | PRN

## 2019-07-29 MED ORDER — DIBUCAINE (PERIANAL) 1 % EX OINT: 1.0000 "application " | TOPICAL_OINTMENT | CUTANEOUS | Status: DC | PRN

## 2019-07-29 MED ORDER — WITCH HAZEL-GLYCERIN EX PADS: 1.0000 "application " | MEDICATED_PAD | CUTANEOUS | Status: DC | PRN

## 2019-07-29 MED ORDER — ONDANSETRON HCL 4 MG PO TABS: 4.0000 mg | ORAL_TABLET | ORAL | Status: DC | PRN

## 2019-07-29 MED ORDER — ZOLPIDEM TARTRATE 5 MG PO TABS: 5.0000 mg | ORAL_TABLET | Freq: Every evening | ORAL | Status: DC | PRN

## 2019-07-29 MED ORDER — OXYTOCIN 40 UNITS IN NORMAL SALINE INFUSION - SIMPLE MED
2.5000 [IU]/h | INTRAVENOUS | Status: DC
  Filled 2019-07-29: qty 1000

## 2019-07-29 MED ORDER — MISOPROSTOL 200 MCG PO TABS
ORAL_TABLET | ORAL | Status: AC
  Filled 2019-07-29: qty 4

## 2019-07-29 MED ORDER — COCONUT OIL OIL: 1.0000 "application " | TOPICAL_OIL | Status: DC | PRN

## 2019-07-29 MED ORDER — PENICILLIN G POT IN DEXTROSE 60000 UNIT/ML IV SOLN: 3.0000 10*6.[IU] | INTRAVENOUS | Status: DC

## 2019-07-29 MED ORDER — BENZOCAINE-MENTHOL 20-0.5 % EX AERO: 1.0000 "application " | INHALATION_SPRAY | CUTANEOUS | Status: DC | PRN

## 2019-07-29 MED ORDER — OXYCODONE-ACETAMINOPHEN 5-325 MG PO TABS: 2.0000 | ORAL_TABLET | ORAL | Status: DC | PRN

## 2019-07-29 MED ORDER — OXYCODONE-ACETAMINOPHEN 5-325 MG PO TABS: 1.0000 | ORAL_TABLET | ORAL | Status: DC | PRN

## 2019-07-29 MED ORDER — OXYTOCIN BOLUS FROM INFUSION
500.0000 mL | Freq: Once | INTRAVENOUS | Status: AC
  Administered 2019-07-29: 07:00:00 500 mL via INTRAVENOUS

## 2019-07-29 NOTE — Discharge Summary (Signed)
  Postpartum Discharge Summary    Patient Name: Evelyn Sutton DOB: 11/14/1990 MRN: [id number]  Date of admission: 07/29/2019 Delivering Provider: CRESENZO-DISHMON, FRANCES   Date of discharge: 07/31/2019  Admitting diagnosis:active labor Intrauterine pregnancy: [redacted]w[redacted]d     Secondary diagnosis:  A2DM Additional problems: GBS +, no tx     Discharge diagnosis: Term Pregnancy Delivered, GDM A2 and Anemia                                                                                                Post partum procedures:None  Augmentation: AROM at delivery  Complications: None  Hospital course:  Onset of Labor With Vaginal Delivery     29 y.o. yo G3P2002 at [redacted]w[redacted]d was admitted in Active Labor on 07/29/2019. Patient had an uncomplicated labor course as follows:  Membrane Rupture Time/Date: 6:52 AM ,07/29/2019   Intrapartum Procedures: Episiotomy: None [1]                                         Lacerations:  None [1]  Patient had a delivery of a Viable infant. 07/29/2019  Information for the patient's newborn:  Schmader, Girl Nija [[id number]]  Delivery Method: Vag-Spont     Pateint had an uncomplicated postpartum course.  She is ambulating, tolerating a regular diet, passing flatus, and urinating well. Patient is discharged home in stable condition on 07/31/19.  Delivery time: 6:55 AM    Magnesium Sulfate received: No BMZ received: No Rhophylac:N/A MMR:No Transfusion:No  Physical exam  Vitals:   07/30/19 0516 07/30/19 1420 07/30/19 2203 07/31/19 0527  BP: 100/61 110/77 104/70 114/72  Pulse: 70 65 73 60  Resp: 18 18 18 18  Temp: 97.9 F (36.6 C) 98.3 F (36.8 C) 98.5 F (36.9 C) 98.2 F (36.8 C)  TempSrc: Oral Oral Oral Oral  SpO2: 100% 98%    Weight:      Height:       General: alert, cooperative and no distress Lochia: appropriate Uterine Fundus: firm Incision: N/A DVT Evaluation: Negative Homan's sign. Labs: Lab Results  Component Value Date   WBC 8.2  07/30/2019   HGB 7.5 (L) 07/30/2019   HCT 25.2 (L) 07/30/2019   MCV 83.2 07/30/2019   PLT 253 07/30/2019   No flowsheet data found. Edinburgh Score: Edinburgh Postnatal Depression Scale Screening Tool 07/29/2019  I have been able to laugh and see the funny side of things. 0  I have looked forward with enjoyment to things. 0  I have blamed myself unnecessarily when things went wrong. 0  I have been anxious or worried for no good reason. 0  I have felt scared or panicky for no good reason. 0  Things have been getting on top of me. 0  I have been so unhappy that I have had difficulty sleeping. 0  I have felt sad or miserable. 0  I have been so unhappy that I have been crying. 0  The thought of harming myself has occurred to me. 0  Edinburgh   Postnatal Depression Scale Total 0    Discharge instruction: per After Visit Summary and "Baby and Me Booklet".  After visit meds:  Allergies as of 07/31/2019   No Known Allergies     Medication List    STOP taking these medications   Accu-Chek FastClix Lancets Misc   Accu-Chek Guide test strip Generic drug: glucose blood   aspirin EC 81 MG tablet   Concept DHA 53.5-38-1 MG Caps   metFORMIN 500 MG tablet Commonly known as: Glucophage   pyridOXINE 25 MG tablet Commonly known as: VITAMIN B-6     TAKE these medications   docusate sodium 100 MG capsule Commonly known as: COLACE Take 1 capsule (100 mg total) by mouth 2 (two) times daily as needed.   ferrous sulfate 325 (65 FE) MG tablet Take 1 tablet (325 mg total) by mouth 2 (two) times daily with a meal.   ibuprofen 600 MG tablet Commonly known as: ADVIL Take 1 tablet (600 mg total) by mouth every 6 (six) hours.   PreNatal DHA 200 MG Caps Generic drug: Docosahexaenoic Acid Take 1 capsule (200 mg total) by mouth daily.       Diet: iron-rich diet  Activity: Advance as tolerated. Pelvic rest for 6 weeks.   Outpatient follow up  Follow up Appt: Future Appointments  Date  Time Provider Department Center  08/23/2019 10:35 AM Rogers, Veronica C, CNM [facility]   Follow up Visit:    Please schedule this patient for Postpartum visit in: 4 weeks with the following provider: Any provider In-Person For C/S patients schedule nurse incision check in weeks 2 weeks: no High risk pregnancy complicated by: GDM Delivery mode:  SVD Anticipated Birth Control:  IUD PP Procedures needed: 2 hour GTT  Schedule Integrated BH visit: no     Newborn Data: Live born female  Birth Weight:   APGAR: 9, 9  Newborn Delivery   Birth date/time: 07/29/2019 06:55:00 Delivery type: Vaginal, Spontaneous      Baby Feeding: Breast Disposition:home with mother   07/31/2019 Virginia Smith, CNM   

## 2019-07-29 NOTE — MAU Note (Signed)
covid swab obtained without difficulty and pt tol well. No symptoms °

## 2019-07-29 NOTE — Telephone Encounter (Signed)
Not able to leave patient a message about Postpartum appointment needing to be changed, due to mailbox being full.

## 2019-07-30 LAB — CBC
HCT: 25.2 % — ABNORMAL LOW (ref 36.0–46.0)
Hemoglobin: 7.5 g/dL — ABNORMAL LOW (ref 12.0–15.0)
MCH: 24.8 pg — ABNORMAL LOW (ref 26.0–34.0)
MCHC: 29.8 g/dL — ABNORMAL LOW (ref 30.0–36.0)
MCV: 83.2 fL (ref 80.0–100.0)
Platelets: 253 10*3/uL (ref 150–400)
RBC: 3.03 MIL/uL — ABNORMAL LOW (ref 3.87–5.11)
RDW: 16.8 % — ABNORMAL HIGH (ref 11.5–15.5)
WBC: 8.2 10*3/uL (ref 4.0–10.5)
nRBC: 0.2 % (ref 0.0–0.2)

## 2019-07-30 MED ORDER — FERROUS SULFATE 325 (65 FE) MG PO TABS
325.0000 mg | ORAL_TABLET | Freq: Two times a day (BID) | ORAL | Status: DC
  Administered 2019-07-30 – 2019-07-31 (×2): 325 mg via ORAL
  Filled 2019-07-30 (×2): qty 1

## 2019-07-30 NOTE — Lactation Note (Signed)
This note was copied from a baby's chart. Lactation Consultation Note  Patient Name: Evelyn Sutton IONGE'X Date: 07/30/2019  P3, 19 hour ETI female infant. LC entered room mom and infant asleep.  Maternal Data    Feeding Feeding Type: Breast Fed  LATCH Score                   Interventions    Lactation Tools Discussed/Used     Consult Status      Evelyn Sutton 07/30/2019, 2:38 AM

## 2019-07-30 NOTE — Progress Notes (Signed)
Post Partum Day 1 Subjective: no complaints, up ad lib, voiding, tolerating PO and + flatus  Objective: Blood pressure 100/61, pulse 70, temperature 97.9 F (36.6 C), temperature source Oral, resp. rate 18, height 5\' 5"  (1.651 m), weight 108 kg, last menstrual period 11/02/2018, SpO2 100 %, unknown if currently breastfeeding.  Physical Exam:  General: alert, cooperative and no distress Lochia: appropriate Uterine Fundus: firm Incision: n/a DVT Evaluation: No evidence of DVT seen on physical exam.  Recent Labs    07/29/19 0658 07/30/19 0514  HGB 9.7* 7.5*  HCT 32.3* 25.2*    Assessment/Plan: Patient desires discharge today but infant on bili lights. Discussed with patient to notify team if plan of care changes and baby can be discharged today. Hgb this am lower, discussed starting iron supplement and patient agreeable.    LOS: 1 day    09/29/19 CNM 07/30/2019, 9:48 AM

## 2019-07-30 NOTE — Lactation Note (Signed)
This note was copied from a baby's chart. Lactation Consultation Note  Patient Name: Evelyn Sutton [id number] Date: 07/30/2019 Reason for consult: Initial assessment  Baby Evelyn Gerdeman now 67 hours old.  Infant DAT positive.  Mom reports her last baby was jaundice and had to be under phototheraphy. Mom reports she has been very sleepy.  Trying to latch her now.  Infant will not wake to latch.  Showed mom how to give some small drops from spoon to rouse her.  Infant started cuing and assisted with latching on left breast. Mom reports comfort.  Nipple round and elongated when infant came off.  Infant bf approximately 20 minutes on left, then fell asleep.  Could not get her to wake back up.  Did a little more hand expression and spoon feeding and infant started rousing again and assisted with latch on right breast in football hold.  Mom reports comfort.  Discussed pumping with mom.  Mom reports she did not really want to pump.  Had to pump with her last baby when she worked.  Discussed adding hand expression and spoon feeding past breastfeeding. Mom able to hand express easily. Left mom and baby breastfeeding on right.  Reviewed and gave [facility] Consultation Breastfeeding Services Handout.  Urged mom to call lactation as needed. Maternal Data Has patient been taught Hand Expression?: Yes Does the patient have breastfeeding experience prior to this delivery?: Yes  Feeding Feeding Type: Breast Fed  LATCH Score Latch: Grasps breast easily, tongue down, lips flanged, rhythmical sucking.  Audible Swallowing: A few with stimulation  Type of Nipple: Everted at rest and after stimulation  Comfort (Breast/Nipple): Soft / non-tender  Hold (Positioning): Assistance needed to correctly position infant at breast and maintain latch.  LATCH Score: 8  Interventions Interventions: Breast feeding basics reviewed;Assisted with latch;Hand express;Adjust position;Support pillows;Position options;Expressed  milk  Lactation Tools Discussed/Used     Consult Status Consult Status: Follow-up Date: 07/31/19 Follow-up type: In-patient    Doyne Ellinger Michaelle Copas 07/30/2019, 3:10 PM

## 2019-07-31 LAB — GLUCOSE, CAPILLARY: Glucose-Capillary: 77 mg/dL (ref 70–99)

## 2019-07-31 MED ORDER — FERROUS SULFATE 325 (65 FE) MG PO TABS: 325.0000 mg | ORAL_TABLET | Freq: Two times a day (BID) | ORAL | 1 refills | Status: DC

## 2019-07-31 MED ORDER — IBUPROFEN 600 MG PO TABS: 600.0000 mg | ORAL_TABLET | Freq: Four times a day (QID) | ORAL | 0 refills | Status: DC

## 2019-08-01 ENCOUNTER — Other Ambulatory Visit (HOSPITAL_COMMUNITY): Payer: Medicaid Other

## 2019-08-01 NOTE — H&P (Signed)
Evelyn Sutton is a 29 y.o. female [phone] with IUP at [redacted]w[redacted]d presenting for labor. Contracting for a few hours, just recently they got stronger.  Membranes are intact, with active fetal movement.   [facility]  Prenatal History/Complications:  Past Medical History: Past Medical History:  Diagnosis Date  . Gestational diabetes     Past Surgical History: Past Surgical History:  Procedure Laterality Date  . NO PAST SURGERIES      Obstetrical History: OB History    Gravida  3   Para  3   Term  3   Preterm      AB      Living  3     SAB      TAB      Ectopic      Multiple  0   Live Births  3        Obstetric Comments  homebirth         Social History: Social History   Socioeconomic History  . Marital status: Single    Spouse name: Not on file  . Number of children: Not on file  . Years of education: Not on file  . Highest education level: Not on file  Occupational History  . Not on file  Tobacco Use  . Smoking status: Never Smoker  . Smokeless tobacco: Never Used  Substance and Sexual Activity  . Alcohol use: Not Currently  . Drug use: Never  . Sexual activity: Yes    Birth control/protection: None  Other Topics Concern  . Not on file  Social History Narrative  . Not on file   Social Determinants of Health   Financial Resource Strain:   . Difficulty of Paying Living Expenses:   Food Insecurity:   . Worried About Charity fundraiser in the Last Year:   . Arboriculturist in the Last Year:   Transportation Needs:   . Film/video editor (Medical):   Marland Kitchen Lack of Transportation (Non-Medical):   Physical Activity:   . Days of Exercise per Week:   . Minutes of Exercise per Session:   Stress:   . Feeling of Stress :   Social Connections:   . Frequency of Communication with Friends and Family:   . Frequency of Social Gatherings with Friends and Family:   . Attends Religious Services:   . Active Member of Clubs or  Organizations:   . Attends Archivist Meetings:   Marland Kitchen Marital Status:     Family History: Family History  Problem Relation Age of Onset  . Hypertension Mother   . Diabetes Father   . Hypertension Father   . Heart disease Father   . Lung cancer Maternal Grandmother   . Liver cancer Maternal Grandmother   . Diabetes Maternal Grandmother     Allergies: No Known Allergies  No medications prior to admission.        Review of Systems   Constitutional: Negative for fever and chills Eyes: Negative for visual disturbances Respiratory: Negative for shortness of breath, dyspnea Cardiovascular: Negative for chest pain or palpitations  Gastrointestinal: Negative for vomiting, diarrhea and constipation.  POSITIVE for abdominal pain (contractions) Genitourinary: Negative for dysuria and urgency Musculoskeletal: Negative for back pain, joint pain, myalgias  Neurological: Negative for dizziness and headaches      Blood pressure 114/72, pulse 60, temperature 98.2 F (36.8 C), temperature source Oral, resp. rate 18, height 5\' 5"  (1.651 m), weight 108 kg, last menstrual  period 11/02/2018, SpO2 98 %, unknown if currently breastfeeding. General appearance: alert, cooperative and mild distress Lungs: normal respiratory effort Heart: regular rate and rhythm Abdomen: soft, non-tender; bowel sounds normal Extremities: Homans sign is negative, no sign of DVT DTR's 2+ Presentation: cephalic Fetal monitoring  Baseline: 140 bpm, Variability: Good {> 6 bpm), Accelerations: Reactive and no decels Uterine activity  q 2 Dilation: 10 Exam by:: Drenda Freeze CNM    Prenatal labs: ABO, Rh: --/--/O POS, O POS Performed at [facility], [address], [place], [place] [address]  [phone]) Antibody: NEG (04/30 0645) Rubella: 2.59 (10/14 0855) RPR: NON REACTIVE (04/30 0658)  HBsAg: NON-REACTIVE (10/14 0855)  HIV: NON-REACTIVE (02/05 0933)   Nursing Staff Provider  Office Location   [place] Dating  LMP  Language  English Anatomy US  Nuchal fold 5.2, f/u normal  Flu Vaccine  Declined Genetic Screen  NIPS: low risk female    AFP:  Negative    TDaP vaccine   Declined Hgb A1C or  GTT Early A1C: 5.2 Third trimester F-92 1-160 2-137  Rhogam  O positive    LAB RESULTS   Feeding Plan Breast  Blood Type O/RH(D) POSITIVE/-- (10/14 0855)O positive   Contraception IUD outpt Antibody NO ANTIBODIES DETECTED (10/14 0855)  Circumcision Female Rubella 2.59 (10/14 0855)Immune   Pediatrician  [facility] RPR NON-REACTIVE (10/14 0855)   Support Person Jomarie Longs HBsAg NON-REACTIVE (10/14 0855)   Prenatal Classes NA HIV NON-REACTIVE (10/14 0855)Non Reactive   BTL Consent Na  GBS  positive; Rx in labor  VBAC Consent Na Pap 2018- Normal     Hgb Electro  Normal   BP Cuff Patient has  CF Negative     SMA 3 copies.     Waterbirth  [ ]  Class [ ]  Consent [ ]  CNM visit    Prenatal Transfer Tool  Maternal Diabetes: Yes:  Diabetes Type:  Diet controlled Genetic Screening: Normal Maternal Ultrasounds/Referrals: Normal Fetal Ultrasounds or other Referrals:  None Maternal Substance Abuse:  No Significant Maternal Medications:  None Significant Maternal Lab Results: Group B Strep positive     Results for orders placed or performed during the hospital encounter of 07/29/19 (from the past 24 hour(s))  Glucose, capillary   Collection Time: 07/31/19  9:36 AM  Result Value Ref Range   Glucose-Capillary 77 70 - 99 mg/dL    Assessment: Evelyn Sutton is a 29 y.o. G3P3003 with an IUP at [redacted]w[redacted]d presenting for active labor  Plan: #Labor: expectant management #Pain:  Per request #FWB Cat 1 #ID: GBS: no time for ppx  #MOF:  breast #MOC: outpt IUD   Evelyn Sutton 08/01/2019, 8:53 AM

## 2019-08-02 ENCOUNTER — Inpatient Hospital Stay (HOSPITAL_COMMUNITY)
Admission: AD | Admit: 2019-08-02 | Payer: Medicaid Other | Source: Home / Self Care | Admitting: Obstetrics and Gynecology

## 2019-08-02 ENCOUNTER — Inpatient Hospital Stay (HOSPITAL_COMMUNITY): Payer: Medicaid Other

## 2019-08-23 ENCOUNTER — Ambulatory Visit: Payer: Medicaid Other | Admitting: Certified Nurse Midwife

## 2019-10-12 ENCOUNTER — Encounter: Payer: Self-pay | Admitting: Obstetrics and Gynecology

## 2019-10-12 ENCOUNTER — Other Ambulatory Visit (HOSPITAL_COMMUNITY)
Admission: RE | Admit: 2019-10-12 | Discharge: 2019-10-12 | Disposition: A | Discharge status: Home / Self Care | Payer: Medicaid Other | Source: Ambulatory Visit | Attending: Obstetrics and Gynecology | Admitting: Obstetrics and Gynecology

## 2019-10-12 ENCOUNTER — Ambulatory Visit (INDEPENDENT_AMBULATORY_CARE_PROVIDER_SITE_OTHER): Payer: Medicaid Other | Admitting: Obstetrics and Gynecology

## 2019-10-12 ENCOUNTER — Other Ambulatory Visit: Payer: Self-pay

## 2019-10-12 DIAGNOSIS — Z30430 Encounter for insertion of intrauterine contraceptive device: Secondary | ICD-10-CM

## 2019-10-12 DIAGNOSIS — Z3202 Encounter for pregnancy test, result negative: Secondary | ICD-10-CM | POA: Diagnosis not present

## 2019-10-12 DIAGNOSIS — F418 Other specified anxiety disorders: Secondary | ICD-10-CM

## 2019-10-12 DIAGNOSIS — O99345 Other mental disorders complicating the puerperium: Secondary | ICD-10-CM

## 2019-10-12 LAB — POCT URINE PREGNANCY: Preg Test, Ur: NEGATIVE

## 2019-10-12 MED ORDER — SERTRALINE HCL 50 MG PO TABS
50.0000 mg | ORAL_TABLET | Freq: Every day | ORAL | 1 refills | Status: DC
Start: 2019-10-12 — End: 2021-07-09

## 2019-10-12 MED ORDER — LEVONORGESTREL 19.5 MCG/DAY IU IUD: INTRAUTERINE_SYSTEM | Freq: Once | INTRAUTERINE | Status: AC

## 2019-10-12 NOTE — Progress Notes (Signed)
Post Partum Visit Note  Evelyn Sutton is a 29 y.o. G85P3003 female who presents for a postpartum visit. She is 10 weeks postpartum following a normal spontaneous vaginal delivery.  I have fully reviewed the prenatal and intrapartum course. The delivery was at 38 gestational weeks.  Anesthesia: none. Postpartum course has been unremarkable except for increase anxiety. Baby is doing wellunremarkable. Baby is feeding by breast. Bleeding no bleeding. Bowel function is normal. Bladder function is normal. Patient is sexually active. Contraception method is condoms. Postpartum depression screening: positive.  Since the birth of her 3rd baby she has had an increase in anxiety and worry. Feels worried about leaving the house for even 30 minuets to go to the grocery store. Recently bough a home and a car.   The following portions of the patient's history were reviewed and updated as appropriate: allergies, current medications, past family history, past medical history, past social history, past surgical history and problem list.  Review of Systems Pertinent items are noted in HPI.    Objective:  Blood pressure 133/83, pulse 85, resp. rate 16, weight 202 lb (91.6 kg), last menstrual period 11/02/2018, currently breastfeeding.  General:  alert  Lungs: clear to auscultation bilaterally  Heart:  regular rate and rhythm, S1, S2 normal, no murmur, click, rub or gallop  Abdomen: soft, non-tender; bowel sounds normal; no masses,  no organomegaly   Vulva:  normal  Vagina: normal vagina  Cervix:  multiparous appearance        Assessment:   Pap smear done at today's visit.  IUD placement Postpartum anxiety Rx: Zoloft Follow up in 4 weeks for string check and mood check.  Would consider counseling   Plan:   Essential components of care per ACOG recommendations:  1.  Mood and well being: Patient with positive depression screening today. Reviewed local resources for support.  - Patient does not use  tobacco. If using tobacco we discussed reduction and for recently cessation risk of relapse - hx of drug use? No   If yes, discussed support systems  2. Infant care and feeding:  -Patient currently breastmilk feeding? Yes If breastmilk feeding discussed return to work and pumping. If needed, patient was provided letter for work to allow for every 2-3 hr pumping breaks, and to be granted a private location to express breastmilk and refrigerated area to store breastmilk. Reviewed importance of draining breast regularly to support lactation. -Social determinants of health (SDOH) reviewed in EPIC. No concerns   3. Sexuality, contraception and birth spacing - Patient does not want a pregnancy in the next year.  Desired family size is 3 children.  - Reviewed forms of contraception in tiered fashion. Patient desired IUD today.   - Discussed birth spacing of 18 months  4. Sleep and fatigue -Encouraged family/partner/community support of 4 hrs of uninterrupted sleep to help with mood and fatigue  5. Physical Recovery  - Discussed patients delivery and complications - Patient had no vaginal laceration, perineal healing reviewed. Patient expressed understanding - Patient has urinary incontinence? No Patient was referred to pelvic floor PT  - Patient is safe to resume physical and sexual activity  6.  Health Maintenance - Last pap smear done 7/14/21HPV.   7. Chronic Disease - Needs 2 hour GTT - PCP follow up - anxiety   Nakira Litzau, Harolyn Rutherford, NP  Center for [facility], [facility]     GYNECOLOGY CLINIC PROCEDURE NOTE  Evelyn Sutton is a 29 y.o. [phone] here  for Liletta IUD insertion. No GYN concerns.  Last pap smear was collected today.   IUD Insertion Procedure Note Patient identified, informed consent performed, consent signed.   Discussed risks of irregular bleeding, cramping, infection, malpositioning or misplacement of the IUD outside the uterus which may require  further procedure such as laparoscopy. Time out was performed.  Urine pregnancy test negative.  Speculum placed in the vagina.  Cervix visualized.  Cleaned with Betadine x 2.  Grasped anteriorly with a single tooth tenaculum.  Uterus sounded to 7 cm.  Liletta IUD placed per manufacturer's recommendations.  Strings trimmed to 3 cm. Tenaculum was removed, good hemostasis noted.  Patient tolerated procedure well.   Patient was given post-procedure instructions.  She was advised to have backup contraception for one week.  Patient was also asked to check IUD strings periodically and follow up in 4 weeks for IUD check.   Duane Lope, NP 10/12/2019 11:34 AM

## 2019-10-12 NOTE — Addendum Note (Signed)
Addended by: Venia Carbon I on: 10/12/2019 11:36 AM   Modules accepted: Level of Service

## 2019-10-19 LAB — CYTOLOGY - PAP
Chlamydia: NEGATIVE
Comment: NEGATIVE
Comment: NEGATIVE
Comment: NORMAL
Diagnosis: UNDETERMINED — AB
High risk HPV: NEGATIVE
Neisseria Gonorrhea: NEGATIVE

## 2019-11-09 ENCOUNTER — Encounter: Payer: Self-pay | Admitting: Obstetrics and Gynecology

## 2019-11-09 ENCOUNTER — Ambulatory Visit: Payer: Medicaid Other | Admitting: Obstetrics and Gynecology

## 2019-11-09 DIAGNOSIS — R87619 Unspecified abnormal cytological findings in specimens from cervix uteri: Secondary | ICD-10-CM | POA: Insufficient documentation

## 2019-11-15 ENCOUNTER — Encounter: Payer: Self-pay | Admitting: Advanced Practice Midwife

## 2019-11-15 ENCOUNTER — Other Ambulatory Visit: Payer: Self-pay

## 2019-11-15 ENCOUNTER — Ambulatory Visit (INDEPENDENT_AMBULATORY_CARE_PROVIDER_SITE_OTHER): Payer: Medicaid Other | Admitting: Advanced Practice Midwife

## 2019-11-15 VITALS — BP 117/75 | HR 84 | Resp 16 | Ht 65.0 in | Wt 202.0 lb

## 2019-11-15 DIAGNOSIS — Z30431 Encounter for routine checking of intrauterine contraceptive device: Secondary | ICD-10-CM

## 2019-11-15 DIAGNOSIS — N921 Excessive and frequent menstruation with irregular cycle: Secondary | ICD-10-CM | POA: Diagnosis not present

## 2019-11-15 DIAGNOSIS — Z975 Presence of (intrauterine) contraceptive device: Secondary | ICD-10-CM

## 2019-11-15 MED ORDER — NORGESTIMATE-ETH ESTRADIOL 0.25-35 MG-MCG PO TABS: 1.0000 | ORAL_TABLET | Freq: Every day | ORAL | 2 refills | Status: DC

## 2019-11-15 NOTE — Patient Instructions (Signed)
Metrorrhagia Metrorrhagia is bleeding from the uterus that happens irregularly but often. The bleeding generally happens between menstrual periods. Follow these instructions at home: Pay attention to any changes in your symptoms. Let your health care provider know about them. Follow these instructions to help with your condition: Eating and drinking   Eat well-balanced meals. Include foods that are high in iron, such as liver, meat, shellfish, green leafy vegetables, and eggs.  If you become constipated: ? Drink plenty of water. Drink enough to keep your urine pale yellow. ? Take over-the-counter or prescription medicines. ? Eat foods that are high in fiber, such as beans, whole grains, and fresh fruits and vegetables. ? Limit foods that are high in fat and processed sugars, such as fried or sweet foods. Medicines  Take over-the-counter and prescription medicines only as told by your health care provider.  Do not change medicines without talking with your health care provider.  Aspirin or medicines that contain aspirin may make the bleeding worse. Do not take these medicines: ? During your period. ? During the week before your period.  If you were prescribed iron pills, take them as told by your health care provider. Iron pills help to replace iron that your body loses because of this condition. Activity  If you need to change your sanitary pad or tampon more than one time every 2 hours: ? Lie in bed with your feet raised (elevated). ? Place a cold pack on your lower abdomen. ? Rest as much as possible until the bleeding stops or slows down. General instructions   For 2 months, write down: ? When your period starts. ? When your period ends. ? When any abnormal bleeding occurs. ? What problems you notice.  Keep all follow-up visits as told by your health care provider. This is important. Contact a health care provider if:  You get light-headed or weak.  You have nausea and  vomiting.  You cannot eat or drink without vomiting.  You feel dizzy or have diarrhea while you are taking medicine.  Have questions about birth control. Get help right away if:  You develop a fever or chills.  You need to change your sanitary pad or tampon more than one time per hour.  Your bleeding becomesheavy.  Your flow contains clots.  You develop pain in your abdomen.  You lose consciousness.  You develop a rash. Summary  Metrorrhagia is bleeding from the uterus that happens irregularly but often, usually between menstrual periods.  Pay attention to any changes in your symptoms. Let your health care provider know about them.  Eat well-balanced meals. Include foods that are high in iron, such as liver, meat, shellfish, green leafy vegetables, and eggs.  Get help right away if you develop a fever, you see clots in your blood, your bleeding becomes heavy, you develop a rash, or you lose consciousness. This information is not intended to replace advice given to you by your health care provider. Make sure you discuss any questions you have with your health care provider. Document Revised: 09/17/2017 Document Reviewed: 09/17/2017 Elsevier Patient Education  2020 Elsevier Inc.  

## 2019-11-15 NOTE — Progress Notes (Signed)
   GYNECOLOGY CLINIC PROGRESS NOTE  History:  29 y.o. H2D9242 here at [facility] today for today for IUD string check; Liletta IUD was placed  10/12/19.  There are no concerning side effects with the IUD. There is some frequent light vaginal bleeding.  The following portions of the patient's history were reviewed and updated as appropriate: allergies, current medications, past family history, past medical history, past social history, past surgical history and problem list. Last pap smear on 10/12/19 was abnormal with ASCUS and negative HRHPV.  Recommended Pap in 3 years with cotesting per ASCCP guidelines.  Review of Systems:  Pertinent items are noted in HPI.   Objective:  Physical Exam Blood pressure 117/75, pulse 84, resp. rate 16, height 5\' 5"  (1.651 m), weight 202 lb (91.6 kg), currently breastfeeding. Gen: NAD Abd: Soft, nontender and nondistended Pelvic: Normal appearing external genitalia; normal appearing vaginal mucosa and cervix.  IUD strings visualized, about 4 cm in length outside cervix.   Assessment & Plan:  Normal IUD check.  1. Breakthrough bleeding associated with intrauterine device (IUD) --Daily spotting x 3 weeks, heavier like menses x 1 day --Rx for Sprintec with 2 refills, pt to treat bleeding x 1 month, stop, have period, then see if bleeding resumes.   --Bleeding will likely become lighter and lighter with IUD in place --F/U in office in 3 months for bleeding/contraceptive follow up  2. IUD check up --Patient to keep IUD in place for 6-7 years; can come in for removal if she desires pregnancy within the next 6-7 years. --Routine preventative health maintenance measures emphasized.  , CNM 11:50 AM

## 2019-12-15 ENCOUNTER — Telehealth: Payer: Self-pay | Admitting: *Deleted

## 2019-12-15 NOTE — Telephone Encounter (Signed)
Left message to call and schedule follow up appointment with Misty Stanley, starting 02/21/2020 or with another provider, starting 02/15/2020.

## 2020-01-09 ENCOUNTER — Telehealth: Payer: Self-pay | Admitting: *Deleted

## 2020-01-09 NOTE — Telephone Encounter (Signed)
Left patient a message to call and schedule 3 month follow up appointment with Misty Stanley.

## 2020-05-23 ENCOUNTER — Ambulatory Visit: Payer: Medicaid Other | Admitting: Obstetrics and Gynecology

## 2020-06-06 ENCOUNTER — Encounter: Payer: Self-pay | Admitting: Obstetrics and Gynecology

## 2020-06-06 ENCOUNTER — Other Ambulatory Visit: Payer: Self-pay

## 2020-06-06 ENCOUNTER — Ambulatory Visit: Payer: Medicaid Other | Admitting: Obstetrics and Gynecology

## 2020-06-06 VITALS — BP 117/77 | HR 73 | Ht 65.0 in | Wt 196.0 lb

## 2020-06-06 DIAGNOSIS — Z30431 Encounter for routine checking of intrauterine contraceptive device: Secondary | ICD-10-CM

## 2020-06-06 NOTE — Progress Notes (Signed)
    GYNECOLOGY OFFICE ENCOUNTER NOTE  History:  30 y.o. [id number] here today for today for IUD string check; Mirena  IUD was placed 10/12/19.  She reports pain in her left flank, left hip and down her left leg. The pain stopped at her left thigh. She reports having random pains like this with her last mirena IUD. When she made her appointment for today she wanted the IUD removed. Today she reports no pain and has not had any pain since making this appointment 2 weeks ago. She would like to keep the IUD in if the strings look appropriate. Denies abnormal vaginal  Bleeding.   The following portions of the patient's history were reviewed and updated as appropriate: allergies, current medications, past family history, past medical history, past social history, past surgical history and problem list. Last pap smear on 10/12/19 was ASC-US.   Review of Systems:  Pertinent items are noted in HPI.   Objective:  Physical Exam Blood pressure 117/77, pulse 73, height 5\' 5"  (1.651 m), weight 196 lb (88.9 kg), currently breastfeeding. CONSTITUTIONAL: Well-developed, well-nourished female in no acute distress.  HENT:  Normocephalic, atraumatic. External right and left ear normal. Oropharynx is clear and moist EYES: Conjunctivae and EOM are normal. Pupils are equal, round, and reactive to light. No scleral icterus.  NECK: Normal range of motion, supple, no masses CARDIOVASCULAR: Normal heart rate noted RESPIRATORY: Effort and breath sounds normal, no problems with respiration noted ABDOMEN: Soft, no distention noted.   PELVIC: Normal appearing external genitalia; normal appearing vaginal mucosa and cervix.  IUD strings visualized, about 2 cm in length outside cervix.   Assessment & Plan:  Patient to keep IUD in place for up to seven years; can come in for removal if she desires pregnancy earlier or for any concerning side effects.    Evelyn Sutton, , NP [facility],  [facility]

## 2020-08-22 ENCOUNTER — Telehealth: Payer: Self-pay | Admitting: *Deleted

## 2020-08-22 NOTE — Telephone Encounter (Signed)
Left patient a message to call and schedule annual after 10/11/2020.

## 2020-09-30 IMAGING — US US MFM OB FOLLOW-UP
1 series · 13 of 28 positions shown · non-contrast
Comparison: none

[Series 1: us mfm ob follow-up · 67 acquisitions, 13 frames shown]
[im 3/67]
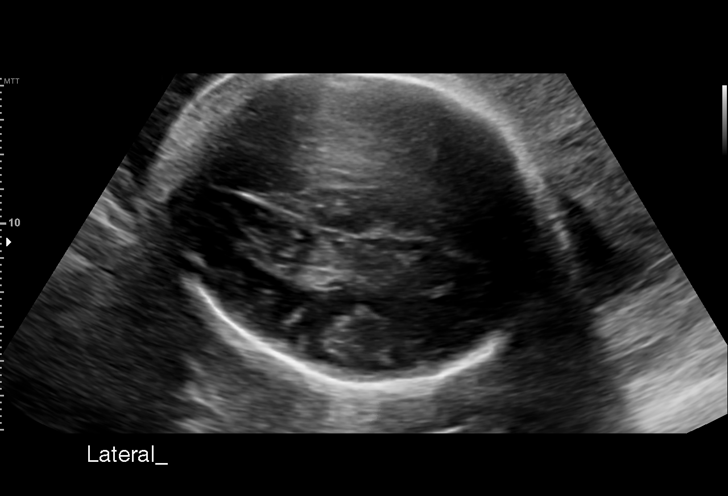
[im 8/67]
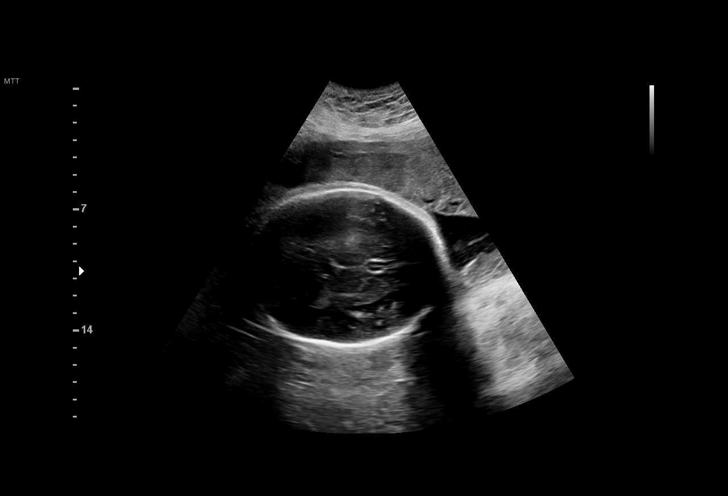
[im 13/67]
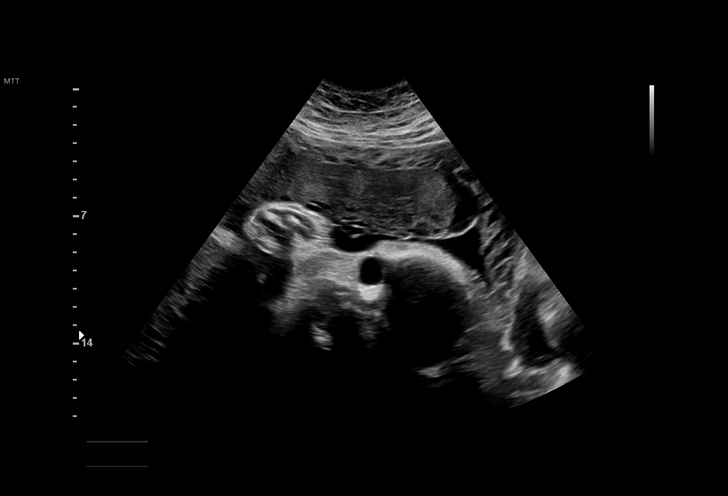
[im 18/67]
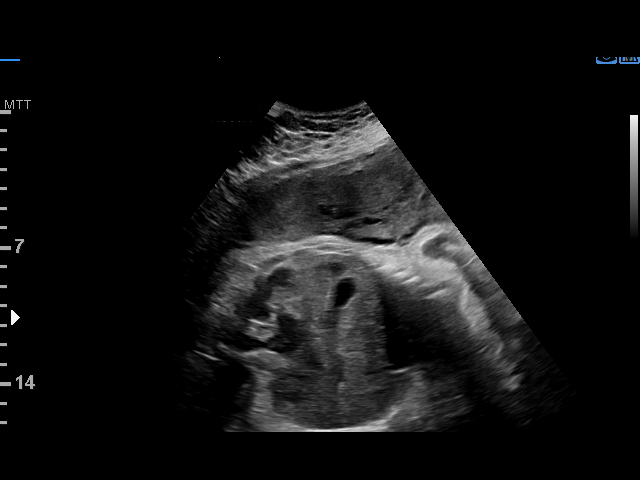
[im 23/67]
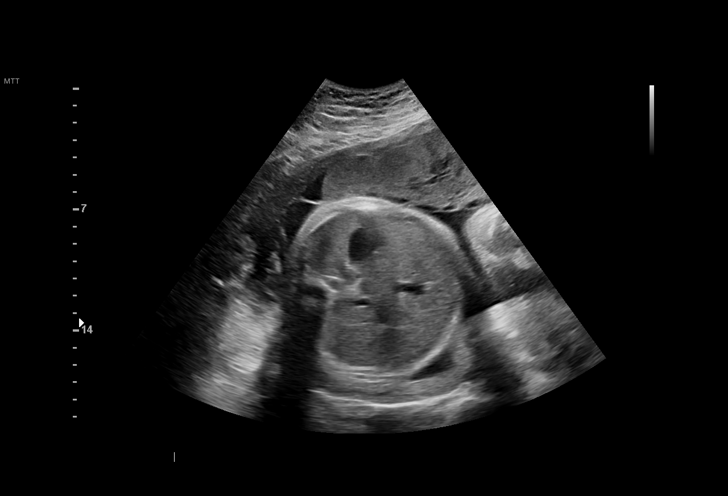
[im 27/67]
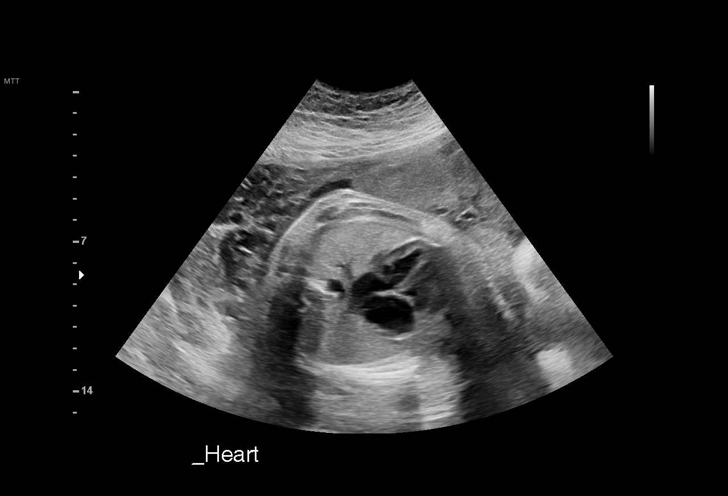
[im 35/67]
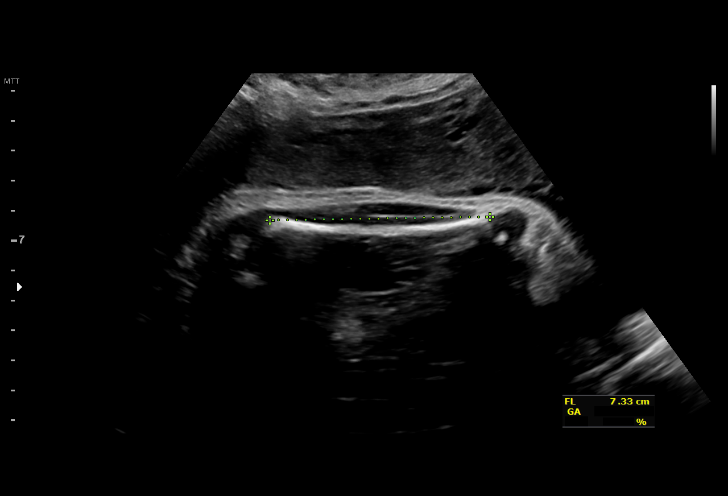
[im 40/67]
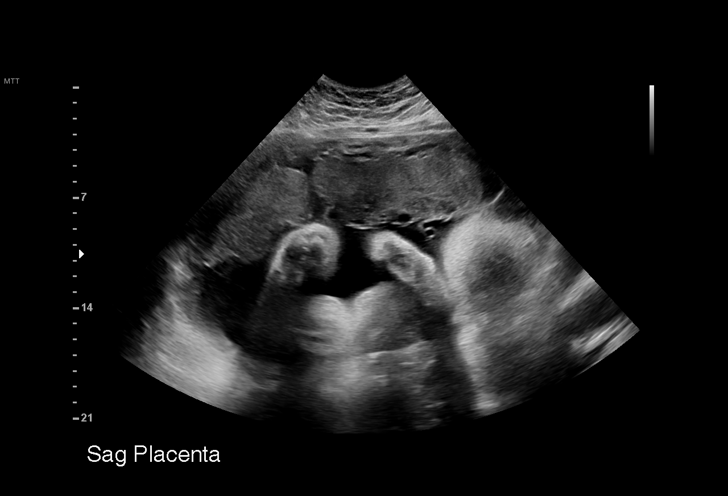
[im 45/67]
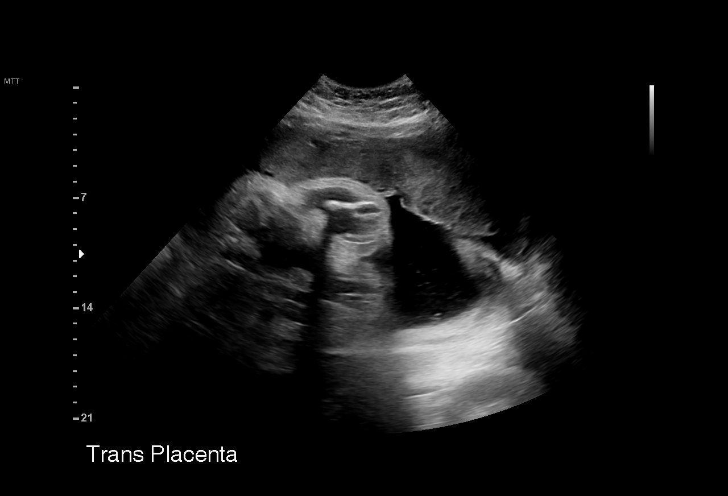
[im 49/67]
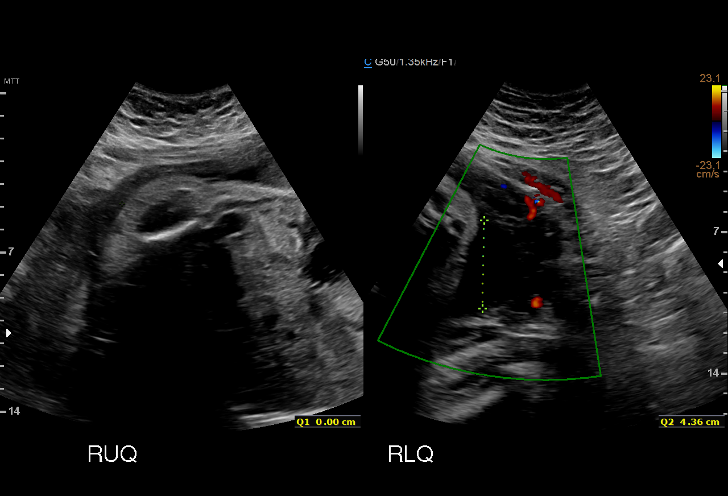
[im 54/67]
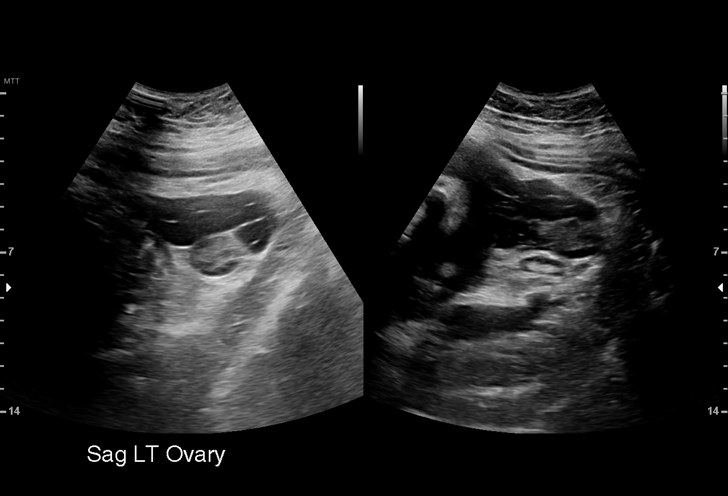
[im 59/67]
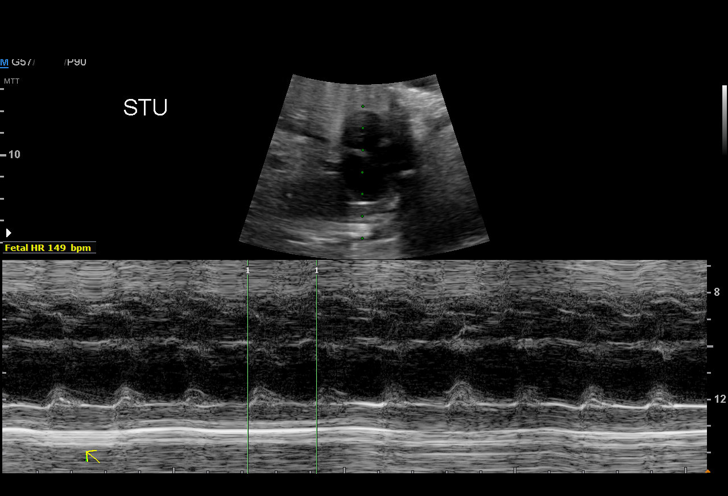
[im 64/67]
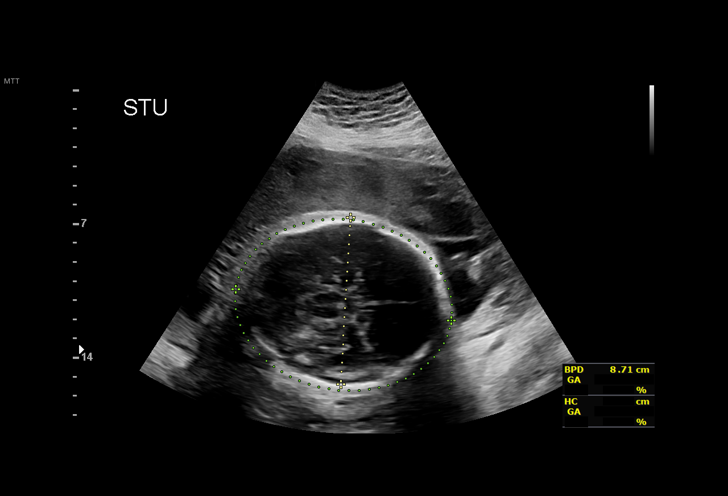

[13 of 28 positions shown; findings below may reference images not displayed]

----------------------------------------------------------------------

 ----------------------------------------------------------------------
Indications

  34 weeks gestation of pregnancy
  Encounter for other antenatal screening
  follow-up
  Obesity complicating pregnancy, third
  trimester (BMI 35)
  Low risk NIPS,
  Gestational diabetes in pregnancy,
  unspecified control
 ----------------------------------------------------------------------
Fetal Evaluation

 Num Of Fetuses:         1
 Fetal Heart Rate(bpm):  167
 Cardiac Activity:       Observed
 Presentation:           Cephalic
 Placenta:               Anterior
 P. Cord Insertion:      Previously Visualized

 Amniotic Fluid
 AFI FV:      Within normal limits

 AFI Sum(cm)     %Tile       Largest Pocket(cm)
 16.35           60

 RUQ(cm)       RLQ(cm)       LUQ(cm)        LLQ(cm)
 0
Biophysical Evaluation

 Amniotic F.V:   Pocket => 2 cm             F. Tone:        Observed
 F. Movement:    Observed                   Score:          [DATE]
 F. Breathing:   Observed
Biometry

 BPD:      90.5  mm     G. Age:  36w 5d         92  %    CI:        78.41   %    70 - 86
                                                         FL/HC:      22.8   %    20.1 -
 HC:      323.3  mm     G. Age:  36w 4d         59  %    HC/AC:      0.96        0.93 -
 AC:      337.7  mm     G. Age:  37w 5d         99  %    FL/BPD:     81.5   %    71 - 87
 FL:       73.8  mm     G. Age:  37w 5d         96  %    FL/AC:      21.9   %    20 - 24
 CER:        48  mm     G. Age:  N/A          > 95  %

 LV:          3  mm

 Est. FW:    2292  gm      7 lb 1 oz     98  %
OB History

 Gravidity:    3         Term:   2        Prem:   0        SAB:   0
 TOP:          0       Ectopic:  0        Living: 2
Gestational Age

 LMP:           34w 6d        Date:  11/02/18                 EDD:   08/09/19
 U/S Today:     37w 1d                                        EDD:   07/24/19
 Best:          34w 6d     Det. By:  LMP  (11/02/18)          EDD:   08/09/19
Anatomy

 Cranium:               Previously seen        Aortic Arch:            Previously seen
 Cavum:                 Previously seen        Ductal Arch:            Previously seen
 Ventricles:            Appears normal         Diaphragm:              Appears normal
 Choroid Plexus:        Previously seen        Stomach:                Appears normal, left
                                                                       sided
 Cerebellum:            Previously seen        Abdomen:                Previously seen
 Posterior Fossa:       Previously seen        Abdominal Wall:         Previously seen
 Nuchal Fold:           Previously seen        Cord Vessels:           Previously seen
 Face:                  Orbits and profile     Kidneys:                Appear normal
                        previously seen
 Lips:                  Previously seen        Bladder:                Appears normal
 Thoracic:              Previously seen        Spine:                  Previously seen
 Heart:                 Appears normal         Upper Extremities:      Previously seen
                        (4CH, axis, and
                        situs)
 RVOT:                  Previously seen        Lower Extremities:      Previously seen
 LVOT:                  Previously seen

 Other:  Heels/feet and open hands/5th digits visualized previously. Nasal
         bone visualized previously.  Technically difficult due to advanced GA
         and fetal position.
Cervix Uterus Adnexa

 Cervix
 Not visualized (advanced GA >68wks)

 Uterus
 No abnormality visualized.

 Left Ovary
 Within normal limits. Not visualized.
 Right Ovary
 Within normal limits. No adnexal mass visualized.

 Cul De Sac
 No free fluid seen.

 Adnexa
 No abnormality visualized.
Impression

 Patient with gestational diabetes returned for fetal growth
 assessment. She reports her fasting levels are higher (up to
 100 mg/dL). She is not taking oral hypoglycemics or insulin.
 Postprandial levels are reportedly normal.
 Her blood pressure today at our office is 121/3SmmUg.
 On ultrasound, amniotic fluid is normal good fetal activity
 seen.  The estimated fetal weight is at the 98th percentile.
 Antenatal testing is reassuring.  BPP [DATE].  Cephalic
 presentation.
 I explained significance of ultrasound.  I discussed the
 importance of good blood glucose control to prevent adverse
 fetal and neonatal outcomes.  Fetal macrosomia can be
 associated with shoulder dystocia or neurological injuries at
 birth.
 I discussed oral hypoglycemics or insulin.  Patient has a
 prenatal visit appointment on 07/06/2019 at your office.  She
 will be discussing medical treatment with you.
 I also discussed timing of delivery.  If diabetes is not well
 controlled, delivery may be considered at 37 to 39 weeks.
Recommendations

 -Continue weekly BPP till delivery.
 -Fetal growth assessment in 3 weeks.
                 Nikki, Funmi

## 2020-10-21 IMAGING — US US MFM OB FOLLOW-UP
1 series · 13 of 28 positions shown · non-contrast
Comparison: none

[Series 1: us mfm ob follow-up · 45 acquisitions, 13 frames shown]
[im 2/45]
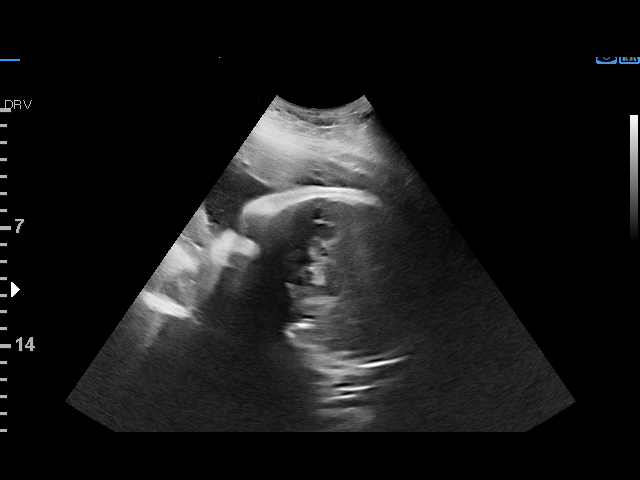
[im 5/45]
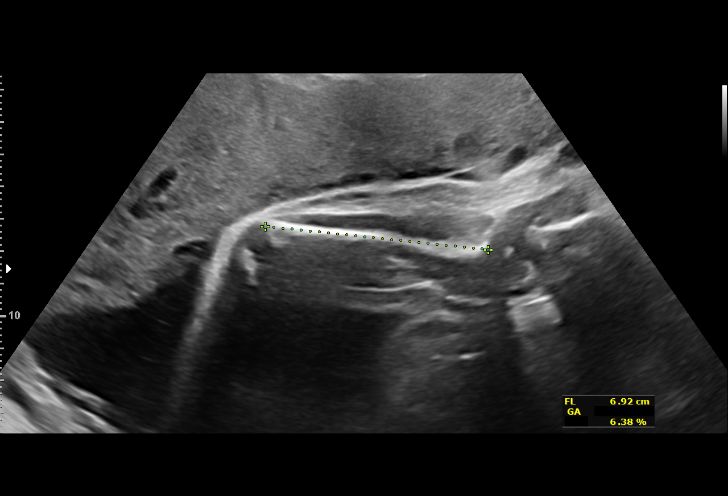
[im 9/45]
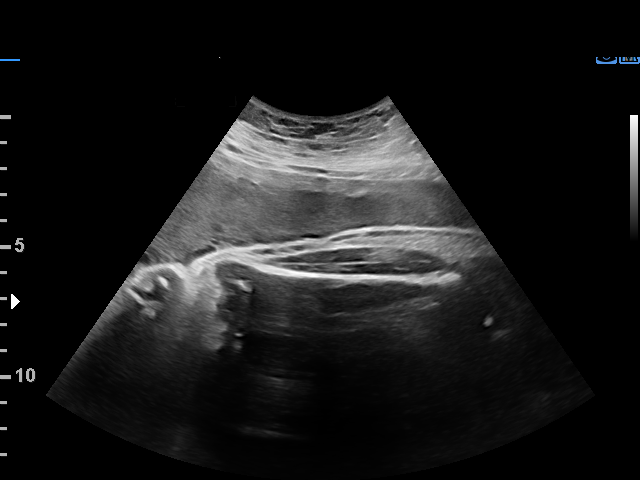
[im 12/45]
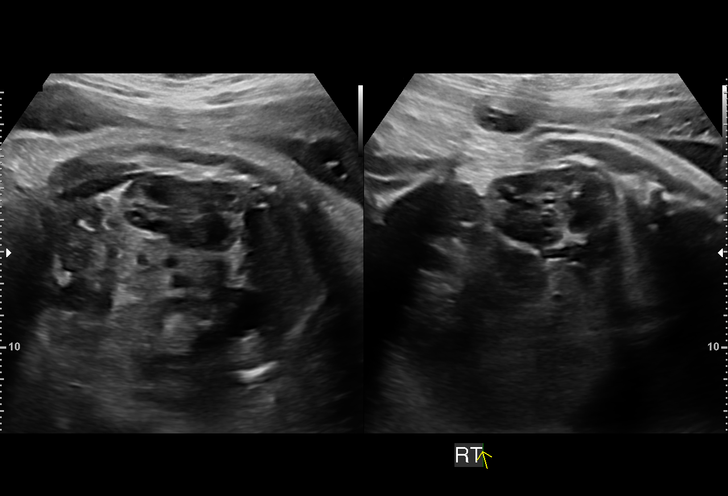
[im 15/45]
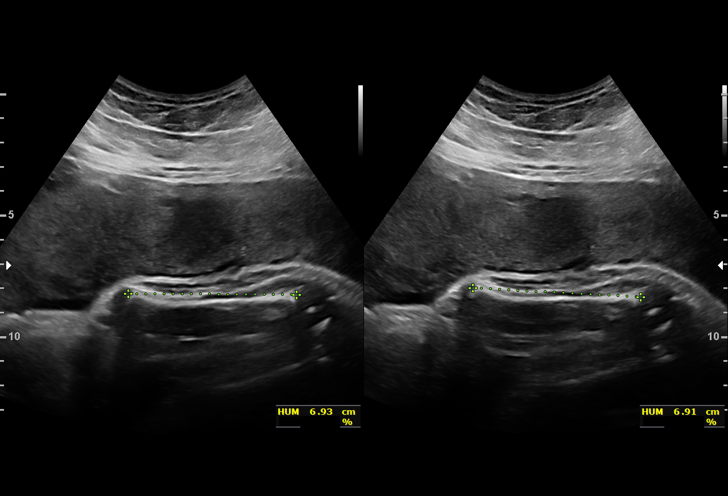
[im 18/45]
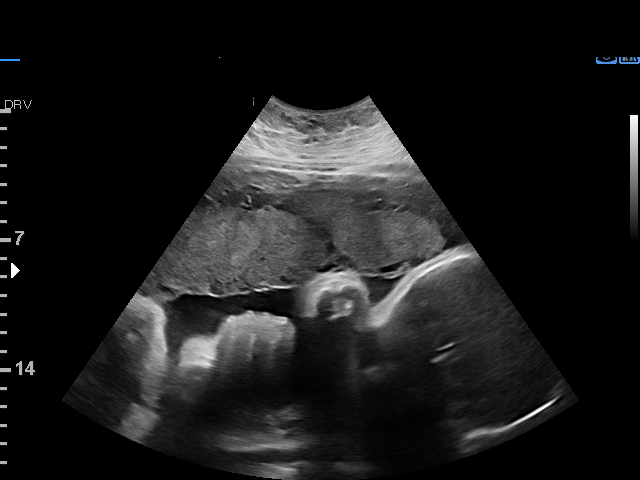
[im 23/45]
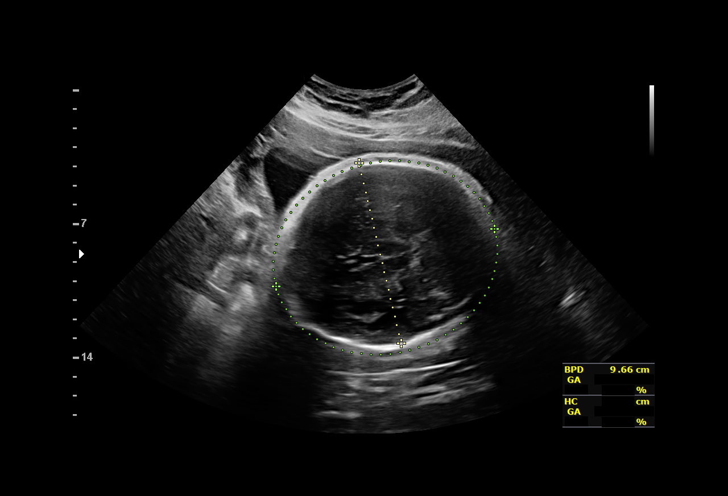
[im 27/45]
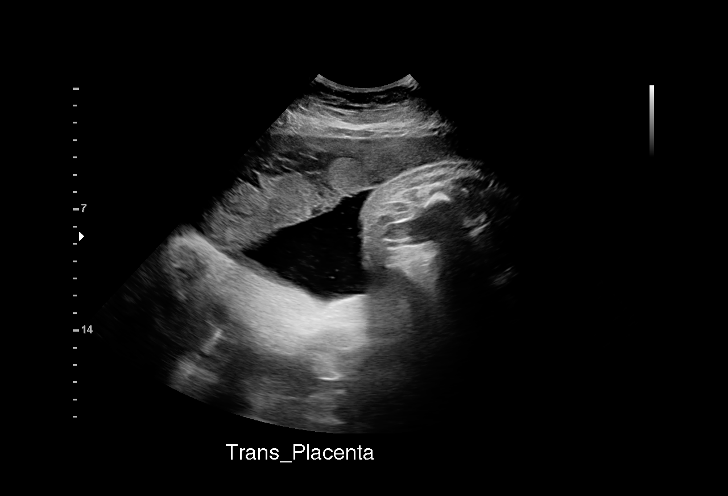
[im 30/45]
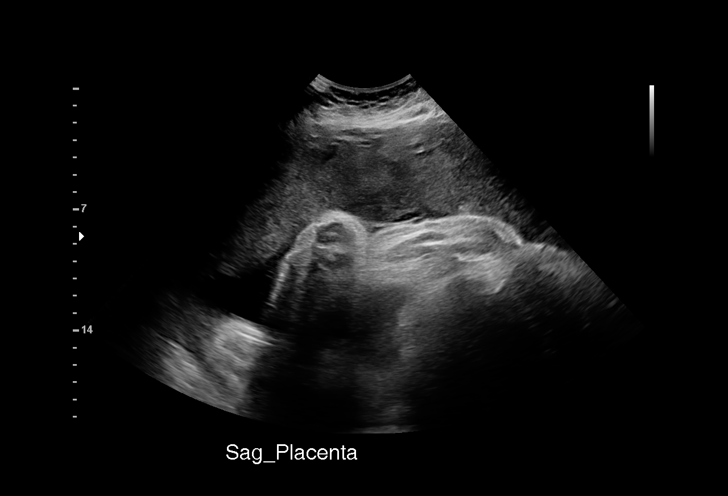
[im 33/45]
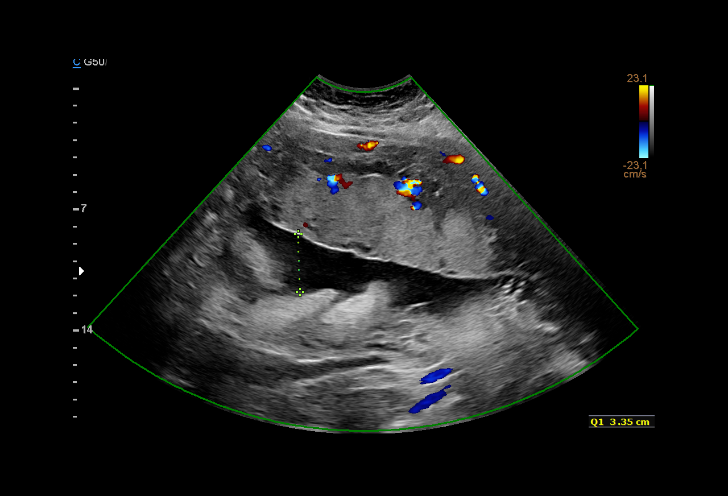
[im 36/45]
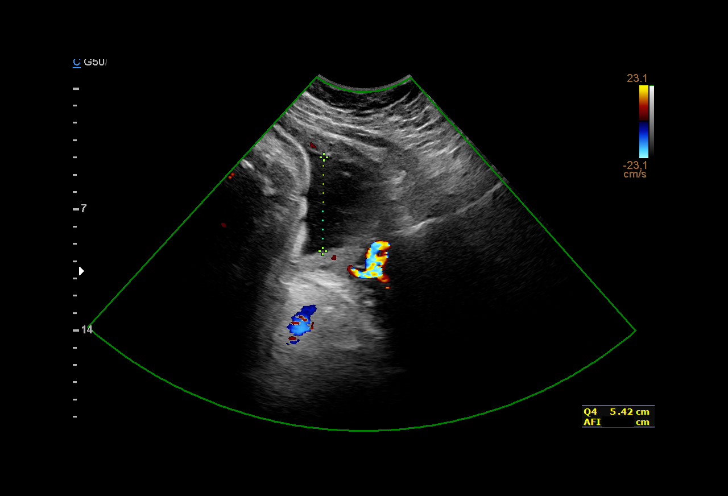
[im 40/45]
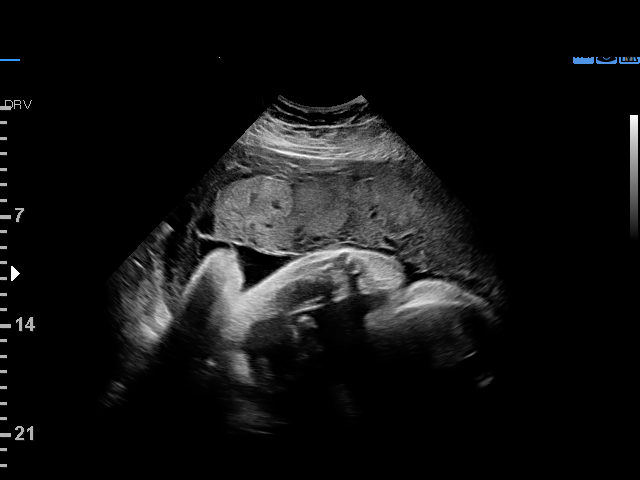
[im 43/45]
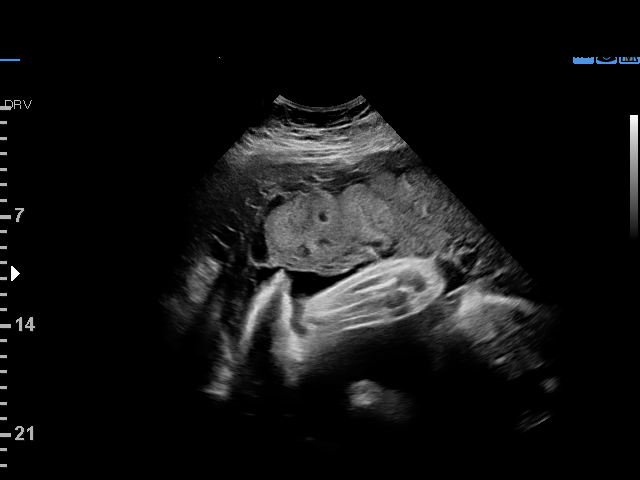

[13 of 28 positions shown; findings below may reference images not displayed]

----------------------------------------------------------------------

 ----------------------------------------------------------------------
Indications

  Gestational diabetes in pregnancy,
  controlled by oral hypoglycemic drugs
  37 weeks gestation of pregnancy
  Obesity complicating pregnancy, third
  trimester (BMI 35)
  Low risk NIPS,
  Encounter for other antenatal screening
  follow-up
 ----------------------------------------------------------------------
Fetal Evaluation

 Num Of Fetuses:         1
 Fetal Heart Rate(bpm):  151
 Cardiac Activity:       Observed
 Presentation:           Cephalic
 Placenta:               Anterior
 P. Cord Insertion:      Previously Visualized

 Amniotic Fluid
 AFI FV:      Within normal limits

 AFI Sum(cm)     %Tile       Largest Pocket(cm)
 18.06           70

 RUQ(cm)       RLQ(cm)       LUQ(cm)        LLQ(cm)

Biophysical Evaluation

 Amniotic F.V:   Within normal limits       F. Tone:        Observed
 F. Movement:    Observed                   Score:          [DATE]
 F. Breathing:   Observed
Biometry

 BPD:      96.5  mm     G. Age:  39w 3d         96  %    CI:        78.58   %    70 - 86
                                                         FL/HC:      23.3   %    20.9 -
 HC:      344.3  mm     G. Age:  39w 6d         75  %    HC/AC:      0.99        0.92 -
 AC:      348.5  mm     G. Age:  38w 5d         87  %    FL/BPD:     83.1   %    71 - 87
 FL:       80.2  mm     G. Age:  41w 0d         98  %    FL/AC:      23.0   %    20 - 24
 HUM:      69.5  mm     G. Age:  N/A          > 95  %

 Est. FW:    0241  gm      8 lb 6 oz     93  %
OB History

 Gravidity:    3         Term:   2        Prem:   0        SAB:   0
 TOP:          0       Ectopic:  0        Living: 2
Gestational Age

 LMP:           37w 6d        Date:  11/02/18                 EDD:   08/09/19
 U/S Today:     39w 5d                                        EDD:   07/27/19
 Best:          37w 6d     Det. By:  LMP  (11/02/18)          EDD:   08/09/19
Anatomy

 Cranium:               Appears normal         Aortic Arch:            Previously seen
 Cavum:                 Previously seen        Ductal Arch:            Previously seen
 Ventricles:            Previously seen        Diaphragm:              Previously seen
 Choroid Plexus:        Previously seen        Stomach:                Appears normal, left
                                                                       sided
 Cerebellum:            Previously seen        Abdomen:                Previously seen
 Posterior Fossa:       Previously seen        Abdominal Wall:         Previously seen
 Nuchal Fold:           Previously seen        Cord Vessels:           Previously seen
 Face:                  Orbits and profile     Kidneys:                Appear normal
                        previously seen
 Lips:                  Previously seen        Bladder:                Appears normal
 Thoracic:              Previously seen        Spine:                  Previously seen
 Heart:                 Previously seen        Upper Extremities:      Previously seen
 RVOT:                  Previously seen        Lower Extremities:      Previously seen
 LVOT:                  Previously seen

 Other:  Heels/feet and open hands/5th digits visualized previously. Nasal
         bone visualized previously.  Technically difficult due to maternal
         habitus and fetal position.
Cervix Uterus Adnexa

 Cervix
 Not visualized (advanced GA >23wks)
Impression

 Gestational diabetes patient takes Metformin for control.
 Patient reports her blood glucose levels are within normal
 range.  Blood pressure today at our office is 115/71 mmHg.
 On ultrasound, the estimated fetal weight is at the 93rd
 percentile.  Cephalic presentation.Amniotic fluid is normal
 and good fetal activity is seen. Antenatal testing is
 reassuring. BPP [DATE].

 I reassured the patient of the findings and informed her that
 ultrasound has limitations in accurately estimating the fetal
 weights.
Recommendations

 Patient has a scheduled induction of labor on 08/02/19.
 No follow-up appointments were ma[REDACTED]

## 2021-07-09 ENCOUNTER — Ambulatory Visit: Payer: Medicaid Other

## 2021-07-09 VITALS — BP 134/76 | HR 57 | Ht 65.0 in | Wt 181.0 lb

## 2021-07-09 DIAGNOSIS — Z01419 Encounter for gynecological examination (general) (routine) without abnormal findings: Secondary | ICD-10-CM

## 2021-07-09 NOTE — Progress Notes (Signed)
? ?  Subjective:  ?  ? Evelyn Sutton is a 31 y.o. female here at [facility] for a routine exam. Current complaints: breakthrough bleeding with IUD since last month. Bleeding is minimal-spotting, but sometimes wears a panty-liner. Had spotting yesterday, but none today. No pain. Concerned IUD is malpositioned.   ? ?Flowsheet Row Procedure visit from 10/12/2019 in [facility] [place]  ?PHQ-2 Total Score 0  ? ?  ? ? ?Health Maintenance Due  ?Topic Date Due  ? COVID-19 Vaccine (1) Never done  ? Hepatitis C Screening  Never done  ? TETANUS/TDAP  Never done  ?  ? ?Risk factors for chronic health problems: ?Smoking: no ?Alchohol/how much: no ?Pt BMI: Body mass index is 30.12 kg/m?. ?  ?Gynecologic History ?No LMP recorded (lmp unknown). (Menstrual status: IUD). ?Contraception: IUD ?Sexual health: monogamous with female partner; difficulty with arousal  ?Last Pap: 10/12/2019. Results were: ASCUS, negative HPV ?Last mammogram: n/a ? ?Obstetric History ?OB History  ?Gravida Para Term Preterm AB Living  ?3 3 3     3   ?SAB IAB Ectopic Multiple Live Births  ?      0 3  ?  ?# Outcome Date GA Lbr Len/2nd Weight Sex Delivery Anes PTL Lv  ?3 Term 07/29/19 [redacted]w[redacted]d 03:48 / 00:07 8 lb 6.6 oz (3.816 kg) F Vag-Spont None  LIV  ?2 Term 11/12/17 [redacted]w[redacted]d / 00:07 9 lb 7.5 oz (4.295 kg) F Vag-Spont None  LIV  ?1 Term 02/07/06 [redacted]w[redacted]d  8 lb (3.629 kg)  Vag-Spont   LIV  ?  ?Obstetric Comments  ?homebirth  ? ? ?The following portions of the patient's history were reviewed and updated as appropriate: allergies, current medications, past family history, past medical history, past social history, past surgical history, and problem list. ? ?Review of Systems ?Pertinent items are noted in HPI.  ?  ?Objective:  ? ?BP 134/76   Pulse (!) 57   Ht 5\' 5"  (1.651 m)   Wt 181 lb (82.1 kg)   LMP  (LMP Unknown)   Breastfeeding No   BMI 30.12 kg/m?  ?VS reviewed, nursing note reviewed,  ?Constitutional: well developed, well nourished, no  distress ?HEENT: normocephalic ?CV: normal rate ?Pulm/chest wall: normal effort ?Breast Exam: performed: right breast normal without mass, skin or nipple changes or axillary nodes, left breast normal without mass, skin or nipple changes or axillary nodes ?Abdomen: soft, non-tender ?Neuro: alert and oriented x 3 ?Skin: warm, dry ?Psych: affect normal ?Pelvic exam: Performed: Cervix pink, visually closed, without lesion, 2 IUD strings visualized, scant white creamy discharge, vaginal walls and external genitalia normal ?Bimanual exam: Cervix 0/long/high, firm, anterior, neg CMT, uterus nontender, nonenlarged, adnexa without tenderness, enlargement, or mass  ? ? ?Assessment/Plan:  ? ?1. Encounter for well woman exam ?- Routine well woman exam ?- Discussed with patient that breakthrough bleeding can occur with IUD at times. Reassured patient that IUD is positioned correctly. If this continues to be an issue for next 2-3 months, can consider adding COC for a month. ?- Arousal with intimacy is a problem for patient. Patient does report stress as well as she feels that there is a lack of foreplay which she thinks is related to arousal difficulty. Reviewed that stress can affect arousal. Encouraged patient to be direct and communicate needs with partner.  ? ?Return in about 1 year (around 07/10/2022).  ? ? , CNM ?11:21 AM  ? ?
